# Patient Record
Sex: Male | Born: 1960 | Race: White | Hispanic: No | Marital: Single | Smoking: Former smoker
Health system: Southern US, Community
[De-identification: ages and names within clinical notes are randomized; demographics above are authoritative.]

## PROBLEM LIST (undated history)

## (undated) DIAGNOSIS — K769 Liver disease, unspecified: Secondary | ICD-10-CM

## (undated) DIAGNOSIS — IMO0001 Reserved for inherently not codable concepts without codable children: Secondary | ICD-10-CM

## (undated) DIAGNOSIS — H539 Unspecified visual disturbance: Secondary | ICD-10-CM

## (undated) DIAGNOSIS — M1990 Unspecified osteoarthritis, unspecified site: Secondary | ICD-10-CM

## (undated) DIAGNOSIS — IMO0002 Reserved for concepts with insufficient information to code with codable children: Secondary | ICD-10-CM

## (undated) DIAGNOSIS — G319 Degenerative disease of nervous system, unspecified: Secondary | ICD-10-CM

## (undated) DIAGNOSIS — R160 Hepatomegaly, not elsewhere classified: Secondary | ICD-10-CM

## (undated) DIAGNOSIS — F419 Anxiety disorder, unspecified: Secondary | ICD-10-CM

## (undated) HISTORY — PX: FINGER SURGERY: SHX640

## (undated) HISTORY — PX: APPENDECTOMY: SHX54

## (undated) HISTORY — PX: ANKLE SURGERY: SHX546

## (undated) HISTORY — DX: Liver disease, unspecified: K76.9

## (undated) HISTORY — DX: Unspecified visual disturbance: H53.9

## (undated) HISTORY — DX: Hepatomegaly, not elsewhere classified: R16.0

## (undated) HISTORY — PX: BACK SURGERY: SHX140

---

## 2000-06-22 ENCOUNTER — Ambulatory Visit (HOSPITAL_BASED_OUTPATIENT_CLINIC_OR_DEPARTMENT_OTHER): Admission: RE | Admit: 2000-06-22 | Discharge: 2000-06-22 | Payer: Self-pay | Admitting: Orthopedic Surgery

## 2000-06-22 ENCOUNTER — Encounter (INDEPENDENT_AMBULATORY_CARE_PROVIDER_SITE_OTHER): Payer: Self-pay | Admitting: Specialist

## 2002-09-27 ENCOUNTER — Ambulatory Visit (HOSPITAL_COMMUNITY): Admission: RE | Admit: 2002-09-27 | Discharge: 2002-09-27 | Payer: Self-pay | Admitting: Family Medicine

## 2002-09-27 ENCOUNTER — Encounter: Payer: Self-pay | Admitting: Family Medicine

## 2002-10-18 ENCOUNTER — Encounter: Payer: Self-pay | Admitting: Family Medicine

## 2002-10-18 ENCOUNTER — Ambulatory Visit (HOSPITAL_COMMUNITY): Admission: RE | Admit: 2002-10-18 | Discharge: 2002-10-18 | Payer: Self-pay | Admitting: Family Medicine

## 2002-10-18 ENCOUNTER — Encounter (HOSPITAL_COMMUNITY): Admission: RE | Admit: 2002-10-18 | Discharge: 2002-11-17 | Payer: Self-pay | Admitting: Orthopaedic Surgery

## 2003-07-20 ENCOUNTER — Emergency Department (HOSPITAL_COMMUNITY): Admission: EM | Admit: 2003-07-20 | Discharge: 2003-07-20 | Payer: Self-pay | Admitting: Emergency Medicine

## 2003-07-22 ENCOUNTER — Emergency Department (HOSPITAL_COMMUNITY): Admission: EM | Admit: 2003-07-22 | Discharge: 2003-07-22 | Payer: Self-pay | Admitting: Emergency Medicine

## 2003-08-16 ENCOUNTER — Emergency Department (HOSPITAL_COMMUNITY): Admission: EM | Admit: 2003-08-16 | Discharge: 2003-08-16 | Payer: Self-pay | Admitting: Emergency Medicine

## 2003-09-30 ENCOUNTER — Inpatient Hospital Stay (HOSPITAL_COMMUNITY): Admission: EM | Admit: 2003-09-30 | Discharge: 2003-10-03 | Payer: Self-pay | Admitting: Emergency Medicine

## 2003-10-06 ENCOUNTER — Emergency Department (HOSPITAL_COMMUNITY): Admission: EM | Admit: 2003-10-06 | Discharge: 2003-10-06 | Payer: Self-pay | Admitting: Emergency Medicine

## 2003-11-08 ENCOUNTER — Ambulatory Visit (HOSPITAL_COMMUNITY): Admission: RE | Admit: 2003-11-08 | Discharge: 2003-11-08 | Payer: Self-pay | Admitting: Family Medicine

## 2003-11-09 ENCOUNTER — Ambulatory Visit (HOSPITAL_COMMUNITY): Admission: RE | Admit: 2003-11-09 | Discharge: 2003-11-09 | Payer: Self-pay | Admitting: Family Medicine

## 2003-11-13 ENCOUNTER — Ambulatory Visit: Payer: Self-pay | Admitting: Urgent Care

## 2003-11-14 ENCOUNTER — Ambulatory Visit (HOSPITAL_COMMUNITY): Admission: RE | Admit: 2003-11-14 | Discharge: 2003-11-14 | Payer: Self-pay | Admitting: Family Medicine

## 2003-11-15 ENCOUNTER — Emergency Department (HOSPITAL_COMMUNITY): Admission: EM | Admit: 2003-11-15 | Discharge: 2003-11-15 | Payer: Self-pay | Admitting: Emergency Medicine

## 2004-01-03 ENCOUNTER — Ambulatory Visit: Payer: Self-pay | Admitting: Internal Medicine

## 2004-01-04 ENCOUNTER — Ambulatory Visit (HOSPITAL_COMMUNITY): Admission: RE | Admit: 2004-01-04 | Discharge: 2004-01-04 | Payer: Self-pay | Admitting: Internal Medicine

## 2004-01-06 ENCOUNTER — Emergency Department (HOSPITAL_COMMUNITY): Admission: EM | Admit: 2004-01-06 | Discharge: 2004-01-07 | Payer: Self-pay | Admitting: *Deleted

## 2004-03-04 ENCOUNTER — Emergency Department (HOSPITAL_COMMUNITY): Admission: EM | Admit: 2004-03-04 | Discharge: 2004-03-04 | Payer: Self-pay | Admitting: *Deleted

## 2004-03-09 ENCOUNTER — Emergency Department (HOSPITAL_COMMUNITY): Admission: EM | Admit: 2004-03-09 | Discharge: 2004-03-09 | Payer: Self-pay | Admitting: Emergency Medicine

## 2004-05-12 ENCOUNTER — Emergency Department (HOSPITAL_COMMUNITY): Admission: EM | Admit: 2004-05-12 | Discharge: 2004-05-12 | Payer: Self-pay | Admitting: *Deleted

## 2004-07-07 ENCOUNTER — Emergency Department (HOSPITAL_COMMUNITY): Admission: EM | Admit: 2004-07-07 | Discharge: 2004-07-07 | Payer: Self-pay | Admitting: *Deleted

## 2004-08-20 ENCOUNTER — Emergency Department (HOSPITAL_COMMUNITY): Admission: EM | Admit: 2004-08-20 | Discharge: 2004-08-20 | Payer: Self-pay | Admitting: Emergency Medicine

## 2004-09-03 ENCOUNTER — Emergency Department (HOSPITAL_COMMUNITY): Admission: EM | Admit: 2004-09-03 | Discharge: 2004-09-03 | Payer: Self-pay | Admitting: Emergency Medicine

## 2004-10-17 ENCOUNTER — Observation Stay (HOSPITAL_COMMUNITY): Admission: EM | Admit: 2004-10-17 | Discharge: 2004-10-18 | Payer: Self-pay | Admitting: Emergency Medicine

## 2004-10-17 ENCOUNTER — Ambulatory Visit: Payer: Self-pay | Admitting: *Deleted

## 2005-04-02 ENCOUNTER — Emergency Department (HOSPITAL_COMMUNITY): Admission: EM | Admit: 2005-04-02 | Discharge: 2005-04-02 | Payer: Self-pay | Admitting: Emergency Medicine

## 2005-04-27 ENCOUNTER — Ambulatory Visit: Payer: Self-pay | Admitting: Cardiology

## 2005-05-07 ENCOUNTER — Ambulatory Visit (HOSPITAL_COMMUNITY): Admission: RE | Admit: 2005-05-07 | Discharge: 2005-05-07 | Payer: Self-pay | Admitting: *Deleted

## 2006-09-05 ENCOUNTER — Inpatient Hospital Stay (HOSPITAL_COMMUNITY): Admission: AD | Admit: 2006-09-05 | Discharge: 2006-09-20 | Payer: Self-pay | Admitting: Psychiatry

## 2006-09-05 ENCOUNTER — Emergency Department (HOSPITAL_COMMUNITY): Admission: EM | Admit: 2006-09-05 | Discharge: 2006-09-05 | Payer: Self-pay | Admitting: Emergency Medicine

## 2006-09-05 ENCOUNTER — Ambulatory Visit: Payer: Self-pay | Admitting: Psychiatry

## 2007-05-26 ENCOUNTER — Emergency Department (HOSPITAL_COMMUNITY): Admission: EM | Admit: 2007-05-26 | Discharge: 2007-05-26 | Payer: Self-pay | Admitting: Emergency Medicine

## 2007-07-19 ENCOUNTER — Emergency Department (HOSPITAL_COMMUNITY): Admission: EM | Admit: 2007-07-19 | Discharge: 2007-07-19 | Payer: Self-pay | Admitting: Emergency Medicine

## 2007-10-17 ENCOUNTER — Emergency Department (HOSPITAL_COMMUNITY): Admission: EM | Admit: 2007-10-17 | Discharge: 2007-10-17 | Payer: Self-pay | Admitting: Emergency Medicine

## 2008-02-07 ENCOUNTER — Emergency Department (HOSPITAL_COMMUNITY): Admission: EM | Admit: 2008-02-07 | Discharge: 2008-02-07 | Payer: Self-pay | Admitting: Emergency Medicine

## 2008-05-23 ENCOUNTER — Emergency Department (HOSPITAL_COMMUNITY): Admission: EM | Admit: 2008-05-23 | Discharge: 2008-05-23 | Payer: Self-pay | Admitting: Emergency Medicine

## 2008-05-29 ENCOUNTER — Ambulatory Visit (HOSPITAL_COMMUNITY): Admission: RE | Admit: 2008-05-29 | Discharge: 2008-05-29 | Payer: Self-pay | Admitting: Family Medicine

## 2008-07-25 ENCOUNTER — Other Ambulatory Visit: Payer: Self-pay

## 2008-07-27 ENCOUNTER — Inpatient Hospital Stay (HOSPITAL_COMMUNITY): Admission: RE | Admit: 2008-07-27 | Discharge: 2008-08-10 | Payer: Self-pay | Admitting: *Deleted

## 2008-07-27 ENCOUNTER — Ambulatory Visit: Payer: Self-pay | Admitting: *Deleted

## 2008-08-06 ENCOUNTER — Encounter: Payer: Self-pay | Admitting: Emergency Medicine

## 2008-10-10 ENCOUNTER — Emergency Department (HOSPITAL_COMMUNITY): Admission: EM | Admit: 2008-10-10 | Discharge: 2008-10-10 | Payer: Self-pay | Admitting: Emergency Medicine

## 2009-04-23 ENCOUNTER — Emergency Department (HOSPITAL_COMMUNITY): Admission: EM | Admit: 2009-04-23 | Discharge: 2009-04-23 | Payer: Self-pay | Admitting: Emergency Medicine

## 2010-03-04 ENCOUNTER — Emergency Department (HOSPITAL_COMMUNITY)
Admission: EM | Admit: 2010-03-04 | Discharge: 2010-03-04 | Disposition: A | Discharge status: Home / Self Care | Payer: Medicaid Other | Attending: Emergency Medicine | Admitting: Emergency Medicine

## 2010-03-04 DIAGNOSIS — F329 Major depressive disorder, single episode, unspecified: Secondary | ICD-10-CM | POA: Insufficient documentation

## 2010-03-04 DIAGNOSIS — G8929 Other chronic pain: Secondary | ICD-10-CM | POA: Insufficient documentation

## 2010-03-04 DIAGNOSIS — M549 Dorsalgia, unspecified: Secondary | ICD-10-CM | POA: Insufficient documentation

## 2010-03-04 DIAGNOSIS — K219 Gastro-esophageal reflux disease without esophagitis: Secondary | ICD-10-CM | POA: Insufficient documentation

## 2010-03-04 DIAGNOSIS — K625 Hemorrhage of anus and rectum: Secondary | ICD-10-CM | POA: Insufficient documentation

## 2010-03-04 DIAGNOSIS — F411 Generalized anxiety disorder: Secondary | ICD-10-CM | POA: Insufficient documentation

## 2010-03-04 DIAGNOSIS — F3289 Other specified depressive episodes: Secondary | ICD-10-CM | POA: Insufficient documentation

## 2010-03-04 DIAGNOSIS — Z79899 Other long term (current) drug therapy: Secondary | ICD-10-CM | POA: Insufficient documentation

## 2010-03-04 LAB — DIFFERENTIAL
Basophils Absolute: 0 10*3/uL (ref 0.0–0.1)
Basophils Relative: 0 % (ref 0–1)
Eosinophils Absolute: 0.1 10*3/uL (ref 0.0–0.7)
Eosinophils Relative: 2 % (ref 0–5)
Lymphocytes Relative: 40 % (ref 12–46)
Lymphs Abs: 2.4 10*3/uL (ref 0.7–4.0)
Monocytes Absolute: 0.5 10*3/uL (ref 0.1–1.0)
Monocytes Relative: 8 % (ref 3–12)
Neutro Abs: 3.1 10*3/uL (ref 1.7–7.7)
Neutrophils Relative %: 51 % (ref 43–77)

## 2010-03-04 LAB — CBC
HCT: 43.9 % (ref 39.0–52.0)
Hemoglobin: 14.3 g/dL (ref 13.0–17.0)
MCH: 30.9 pg (ref 26.0–34.0)
MCHC: 32.6 g/dL (ref 30.0–36.0)
MCV: 94.8 fL (ref 78.0–100.0)
Platelets: 147 10*3/uL — ABNORMAL LOW (ref 150–400)
RBC: 4.63 MIL/uL (ref 4.22–5.81)
RDW: 13.4 % (ref 11.5–15.5)
WBC: 6.1 10*3/uL (ref 4.0–10.5)

## 2010-03-04 LAB — URINALYSIS, ROUTINE W REFLEX MICROSCOPIC
Nitrite: NEGATIVE
Protein, ur: NEGATIVE mg/dL
Urine Glucose, Fasting: NEGATIVE mg/dL

## 2010-03-04 LAB — BASIC METABOLIC PANEL
BUN: 17 mg/dL (ref 6–23)
Chloride: 100 mEq/L (ref 96–112)
Creatinine, Ser: 1.24 mg/dL (ref 0.4–1.5)
GFR calc Af Amer: >60 mL/min (ref >60)
GFR calc non Af Amer: >60 mL/min (ref >60)
Potassium: 4.2 mEq/L (ref 3.5–5.1)

## 2010-03-04 LAB — PROTIME-INR
INR: 0.99 (ref 0.00–1.49)
Prothrombin Time: 13.3 seconds (ref 11.6–15.2)

## 2010-03-04 LAB — APTT: aPTT: 26 seconds (ref 24–37)

## 2010-03-12 ENCOUNTER — Other Ambulatory Visit (HOSPITAL_COMMUNITY): Payer: Self-pay | Admitting: Family Medicine

## 2010-03-12 ENCOUNTER — Ambulatory Visit (HOSPITAL_COMMUNITY)
Admission: RE | Admit: 2010-03-12 | Discharge: 2010-03-12 | Disposition: A | Discharge status: Home / Self Care | Payer: Medicaid Other | Source: Ambulatory Visit | Attending: Family Medicine | Admitting: Family Medicine

## 2010-03-12 DIAGNOSIS — M47812 Spondylosis without myelopathy or radiculopathy, cervical region: Secondary | ICD-10-CM | POA: Insufficient documentation

## 2010-03-12 DIAGNOSIS — M542 Cervicalgia: Secondary | ICD-10-CM

## 2010-03-12 DIAGNOSIS — M5380 Other specified dorsopathies, site unspecified: Secondary | ICD-10-CM | POA: Insufficient documentation

## 2010-04-02 LAB — COMPREHENSIVE METABOLIC PANEL
ALT: 12 U/L (ref 0–53)
AST: 19 U/L (ref 0–37)
Albumin: 3.9 g/dL (ref 3.5–5.2)
Alkaline Phosphatase: 57 U/L (ref 39–117)
Chloride: 102 mEq/L (ref 96–112)
GFR calc Af Amer: >60 mL/min (ref >60)
Potassium: 4.2 mEq/L (ref 3.5–5.1)
Sodium: 137 mEq/L (ref 135–145)
Total Bilirubin: 0.5 mg/dL (ref 0.3–1.2)
Total Protein: 6.8 g/dL (ref 6.0–8.3)

## 2010-04-02 LAB — DIFFERENTIAL
Basophils Absolute: 0 10*3/uL (ref 0.0–0.1)
Basophils Relative: 0 % (ref 0–1)
Eosinophils Relative: 3 % (ref 0–5)
Monocytes Absolute: 0.5 10*3/uL (ref 0.1–1.0)
Monocytes Relative: 10 % (ref 3–12)
Neutro Abs: 3.3 10*3/uL (ref 1.7–7.7)

## 2010-04-02 LAB — CBC
HCT: 40.7 % (ref 39.0–52.0)
Platelets: 138 10*3/uL — ABNORMAL LOW (ref 150–400)
RDW: 14.9 % (ref 11.5–15.5)
WBC: 5.1 10*3/uL (ref 4.0–10.5)

## 2010-04-05 ENCOUNTER — Emergency Department (HOSPITAL_COMMUNITY)
Admission: EM | Admit: 2010-04-05 | Discharge: 2010-04-05 | Disposition: A | Discharge status: Home / Self Care | Payer: Medicaid Other | Attending: Emergency Medicine | Admitting: Emergency Medicine

## 2010-04-05 DIAGNOSIS — K625 Hemorrhage of anus and rectum: Secondary | ICD-10-CM | POA: Insufficient documentation

## 2010-04-05 DIAGNOSIS — M25469 Effusion, unspecified knee: Secondary | ICD-10-CM

## 2010-04-06 ENCOUNTER — Other Ambulatory Visit (HOSPITAL_COMMUNITY): Payer: Medicaid Other

## 2010-04-18 LAB — POCT CARDIAC MARKERS: Troponin i, poc: <0.05 ng/mL (ref 0.00–0.09)

## 2010-04-18 LAB — BASIC METABOLIC PANEL
BUN: 11 mg/dL (ref 6–23)
Creatinine, Ser: 1.26 mg/dL (ref 0.4–1.5)
GFR calc non Af Amer: >60 mL/min (ref >60)
Glucose, Bld: 183 mg/dL — ABNORMAL HIGH (ref 70–99)
Potassium: 3.3 mEq/L — ABNORMAL LOW (ref 3.5–5.1)

## 2010-04-20 LAB — DIFFERENTIAL
Eosinophils Relative: 1 % (ref 0–5)
Lymphocytes Relative: 17 % (ref 12–46)
Lymphs Abs: 1.2 10*3/uL (ref 0.7–4.0)
Monocytes Absolute: 0.5 10*3/uL (ref 0.1–1.0)

## 2010-04-20 LAB — CBC
HCT: 37.5 % — ABNORMAL LOW (ref 39.0–52.0)
Hemoglobin: 13.1 g/dL (ref 13.0–17.0)
MCV: 86.8 fL (ref 78.0–100.0)
WBC: 7 10*3/uL (ref 4.0–10.5)

## 2010-04-20 LAB — BASIC METABOLIC PANEL
Chloride: 97 mEq/L (ref 96–112)
GFR calc non Af Amer: >60 mL/min (ref >60)
Glucose, Bld: 100 mg/dL — ABNORMAL HIGH (ref 70–99)
Potassium: 3.3 mEq/L — ABNORMAL LOW (ref 3.5–5.1)
Sodium: 132 mEq/L — ABNORMAL LOW (ref 135–145)

## 2010-04-20 LAB — RAPID URINE DRUG SCREEN, HOSP PERFORMED
Amphetamines: NOT DETECTED
Benzodiazepines: POSITIVE — AB
Tetrahydrocannabinol: NOT DETECTED

## 2010-04-20 LAB — ETHANOL: Alcohol, Ethyl (B): <5 mg/dL (ref 0–10)
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
## 2010-05-27 NOTE — H&P (Signed)
[name], MORT NO.:  [id number]   MEDICAL RECORD NO.:  [id number]          PATIENT TYPE:  IPS   LOCATION:  0503                          FACILITY:  [facility]   PHYSICIAN:  Geoffery Lyons, M.D.      DATE OF BIRTH:  08-Apr-1960   DATE OF ADMISSION:  09/05/2006  DATE OF DISCHARGE:                       PSYCHIATRIC ADMISSION ASSESSMENT   IDENTIFYING INFORMATION:  This is a 50 year old male voluntarily  admitted on September 05, 2006.   HISTORY OF PRESENT ILLNESS:  The patient presents with a history  depression for one week.  Has been increasing.  He had a plan to drive  into traffic.  The patient states that he has been staying in bed.  He  daydreams all day, isolates. finds himself in a bad mood.  He states his  appetite has been satisfactory.  Reports a history of drinking one year  ago.  Stressors are worrying about finances and reports a new  girlfriend.   PAST PSYCHIATRIC HISTORY:  First admission to [facility].  Was in [place] in the past.  A remote history of hospitalization  for depression.  Was admitted for a three-week period.  In the past has  been on antidepressants and has found them ineffective.   SOCIAL HISTORY:  This is a 50 year old male.  He lives with his son and  an elderly male.  His son is 108 years of age who recently got a DUI.  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   FAMILY HISTORY:  Denies.   ALCOHOL/DRUG HISTORY:  The patient denies any current alcohol or drug  use.   PRIMARY CARE PHYSICIAN:  Dr. Sudie Bailey in [place].   MEDICAL PROBLEMS:  Acid reflux.   MEDICATIONS:  Has been on Xanax 2 mg q.i.d., Zyprexa 10 mg daily and  Prevacid 30 mg daily.   ALLERGIES:  No known allergies.   PHYSICAL EXAMINATION:  The patient was assessed at [facility].  This is a middle-aged male who is unkempt, has long hair, unshaved but  again in no acute distress.  Seems to have significant  depression.  Temperature is 98.5, heart rate 94, respirations 20, blood pressure  126/94, 97% saturation, weight 174 pounds, height 6 feet 1 inch tall.   LABORATORY DATA:  CBC within normal limits.  Alcohol level less than 5.  Urine drug screen is positive for benzodiazepines.  Urinalysis is  negative.   MENTAL STATUS EXAM:  This is a fully alert, cooperative male, fair eye  contact.  Again somewhat unkempt.  Speech is clear, normal pace and  tone.  The patient's mood is depressed.  Affect is flat.  Thought  processes endorsing suicidal thoughts.  No evidence of any delusions or  hallucinations.  Cognitive function intact.  Memory is good.  Judgment  and insight are fair.  Concentration intact.   DIAGNOSES:  AXIS I:  Major depressive disorder, recurrent, severe.  AXIS II:  Deferred.  AXIS III:  Gastroesophageal reflux disease.  AXIS IV:  Other psychosocial problems related to burden of illness.  AXIS V:  Current 30.   PLAN:  Contract for safety.  Stabilize his mood and thinking.  Discussion of tapering of Xanax was discussed with the patient and we  will begin to taper Xanax by 1/2 mg at a time per dose.  We will also  add Neurontin to assist with symptoms of anxiety and mood stabilization.  Consider family session and casemanager will assess follow-up with the  patient and psychiatrist.   TENTATIVE LENGTH OF STAY:  Four to six days.      Landry Corporal, N.P.      Geoffery Lyons, M.D.  Electronically Signed    JO/MEDQ  D:  09/06/2006  T:  09/06/2006  Job:  [id number]

## 2010-05-30 NOTE — Discharge Summary (Signed)
NAMEDURELLE, ZEPEDA               ACCOUNT NO.:  [id number]   MEDICAL RECORD NO.:  [id number]          PATIENT TYPE:  OBV   LOCATION:  A209                          FACILITY:  [facility]   PHYSICIAN:  Mila Homer. Sudie Bailey, M.D.DATE OF BIRTH:  February 21, 1960   DATE OF ADMISSION:  10/17/2004  DATE OF DISCHARGE:  10/07/2006LH                                 DISCHARGE SUMMARY   This 49 year old was admitted to the hospital with chest pain. He had an  overnight stay for workup of same. The pain had cleared by the day of  discharge. His admission CBC, BNP, PT/INR, and cardiac markers were all  normal. His urine drug screen showed benzodiazepines and his cardiac panels  x3 were negative. Chest x-ray was normal.   He was put into the hospital under observation and seen by Dr. Vida Roller of [facility]. He was given O2 at two liters by nasal  cannula. Continued on Zyprexa 2.5 mg daily with Protonix 40 mg daily, Xanax  2 mg t.i.d., Lexapro 20 mg daily. Again, he was symptom-free, ready for  discharge home the second day.   ASSESSMENT:  1.  Chest wall pain, questionable etiology.  2.  Manic depressive disease.   He will be discharged home on his admission medications including alprazolam  2 mg t.i.d., Zyprexa 2.5 mg daily, and Protonix 40 mg daily from the office.  Further workup will be with myself and Dr. Vida Roller if he has  recurrent symptomatology.      Mila Homer. Sudie Bailey, M.D.  Electronically Signed     SDK/MEDQ  D:  10/18/2004  T:  10/18/2004  Job:  [id number]

## 2010-05-30 NOTE — Discharge Summary (Signed)
[name], [name] NO.:  [id number]   MEDICAL RECORD NO.:  [id number]          PATIENT TYPE:  IPS   LOCATION:  0503                          FACILITY:  [facility]   PHYSICIAN:  Geoffery Lyons, M.D.      DATE OF BIRTH:  1960/11/07   DATE OF ADMISSION:  09/05/2006  DATE OF DISCHARGE:  09/20/2006                               DISCHARGE SUMMARY   CHIEF COMPLAINT AND PRESENT ILLNESS:  This was the first admission to  [facility] for this 50 year old male voluntarily  admitted.  History of depression, has been increasing, had a plan to  drive into traffic.  He had been staying in bed, daydreams all day,  isolates, finds himself in a bad mood.  History of drinking a year ago.  Claimed worrying about finances and reports new girlfriend.  Had been  using Xanax, prescribed 2 mg four times a day, using much more than  that.  Wanting to come to detox and get help with his mood.   PAST PSYCHIATRIC HISTORY:  First time at [facility].  Was at [facility] in the past.  He has been on antidepressant.  Has found  them to be ineffective.   ALCOHOL/DRUG HISTORY:  Persistent abuse of Xanax as already stated.   MEDICAL HISTORY:  Reflux.   MEDICATIONS:  Xanax 2 mg four times a day and he takes more than  prescribed, Zyprexa 10 mg per day, Prevacid 30 mg per day.   PHYSICAL EXAMINATION:  Performed and failed to show any acute findings.   LABORATORY DATA:  SGOT 14, SGPT 12, total bilirubin 1.0.  CBC within  normal limits.  UDS positive for benzodiazepines.   MENTAL STATUS EXAM:  Fully alert cooperative male, fair eye contact,  unkempt.  Speech was clear, normal in rate, tempo and production.  Depressed mood.  Affect constricted.  Thought processes endorsed  suicidal thoughts, feeling overwhelmed but no evidence of delusions.  No  hallucinations.  No homicidal ideas.  Cognition well-preserved.   ADMISSION DIAGNOSES:  AXIS I:  Benzodiazepine dependence.   Major  depressive disorder.  AXIS II:  No diagnosis.  AXIS III:  Gastroesophageal reflux.  AXIS IV:  Moderate.  AXIS V:  GAF upon admission 30; highest GAF in the last year 60.   HOSPITAL COURSE:  He was admitted.  He was started in individual and  group psychotherapy.  We started the process of dropping the Xanax.  Kept him on the Zyprexa and he was started on Effexor and he was given  some Neurontin.  As already stated, he endorsed depression for the last  4-5 years, worse in the last week.  Depression for the last 4-5 years,  has been worse the last three to four weeks.  Girlfriend is in and out.  Was in [place] several years ago, separated from the girlfriend of  23 years, separated for three weeks, endorsed suicidal ideations then.  His primary care Nasha Diss gave him the Xanax and the Zyprexa.  Son lives  with him, 39 year old, has had a DUI.  There  is also another gentleman  that lives with him.  Working in the [employer] in [place]  for the last two years, Xanax for several years, has had thoughts of  killing himself.  Has been on Prozac, Zoloft, Paxil, Wellbutrin and  Lexapro.  On September 07, 2006, endorsed persistent depression, evidenced  psychomotor retardation, a sense of hopelessness and helplessness, in  bed, disheveled, avoids, isolates, depressed, anxious mood.  We  continued to drop the Xanax and worked with the Neurontin.  On September 08, 2006, he was still very depressed, psychomotor retardation, in bed.  No energy, no motivation, unable to go to group.  His affect was pretty  flat.  Would not answer questions about suicidal thoughts.  We continued  to drop the Xanax, increase the Effexor and work with the Neurontin.  As  we dropped the Xanax, had some difficulties, has been up, making more of  an effort to be involved but still depressed, feeling overwhelmed, sense  of hopelessness and helplessness.  On September 10, 2006, he was  experiencing  cravings, not only for the Xanax, but also for alcohol.  Working on pushing himself, get out of bed and go to groups.  Endorsed  depression, anxiety, cravings.  As we pursued further clarification of  the history, he was taking more than 8 mg of Xanax per day.  Due to the  agitation and the spells of anxiety, he was given a trial with  Risperdal.  On September 14, 2006, he was endorsing agitation, feeling of  passing out, felt that the Zyprexa and later the Risperdal were helping  with the feeling of agitation and passing out.  Did not see that as a  side effect from the medication.  We continued to detox.  Xanax was  going to be dropped to 2.5 mg twice a day.  We continued to work with  the Neurontin.  On September 15, 2006, increased tremulousness,  agitation, benzodiazepine withdrawal versus EPS, akathisia.  He did  respond to Librium and Cogentin.  We pursued the Librium and kept the  Cogentin on a standing order.  Endorsed that he would hurt himself if  released from the hospital.  We already switched to Librium.  Endorsed  anxiety, agitation, was starting to go down on the benzodiazepine so we  continued to slowly detox.  We tried to simplify.  We decreased the  Neurontin further to rule out any possible adverse side effects from the  medication he was taking.  He was evidencing some on and off shakes and  dizzy spells but gradually started getting better and better.  On  September 18, 2006, he was less shaky, did sleep most of the time, wanted  to make this work so we decreased the Neurontin and decreased the  Librium.  On September 19, 2006, he was better, tolerating the Librium  down to 25 mg twice a day.  We went down to Librium 10 mg three times a  day.  By September 20, 2006, endorsed that he was really feeling better.  Endorsed he was not going back to use the Xanax.  Feeling so much  better.  No evidence of shakiness or any other symptoms suggestive of  active withdrawal.  He was  still taking Librium 10 mg.  He was wanting  to go home.  Felt safe to go, so we were working towards finish detoxing  on an outpatient basis.  Endorsed no active suicidal or homicidal  ideation.   DISCHARGE DIAGNOSES:  AXIS I:  Benzodiazepine dependence.  Anxiety  disorder not otherwise specified.  Major depressive disorder.  AXIS II:  No diagnosis.  AXIS III:  Gastroesophageal reflux.  AXIS IV:  Moderate.  AXIS V:  GAF upon discharge 55-60.   DISCHARGE MEDICATIONS:  1. Zyprexa 10 mg at bedtime.  2. Effexor XR 75 mg per day.  3. Protonix 40 mg twice a day.  4. Librium 10 mg, 1 three times a day for two days, then Librium 10      mg, 1 twice a day for two days, then      Librium 10 mg, 1 daily for two days.  5. Neurontin 100 mg twice a day.   FOLLOWUP:  [facility].      Geoffery Lyons, M.D.  Electronically Signed     IL/MEDQ  D:  10/18/2006  T:  10/18/2006  Job:  [id number]

## 2010-05-30 NOTE — Discharge Summary (Signed)
NAMEMEHTAB, DOLBERRY NO.:  [id number]   MEDICAL RECORD NO.:  [id number]          PATIENT TYPE:  IPS   LOCATION:  0306                          FACILITY:  [facility]   PHYSICIAN:  Jasmine Pang, M.D. DATE OF BIRTH:  1960-07-03   DATE OF ADMISSION:  07/27/2008  DATE OF DISCHARGE:  08/10/2008                               DISCHARGE SUMMARY   IDENTIFICATION:  This is a 50 year old divorced white male, who was  admitted on a voluntary basis on July 27, 2008.   HISTORY OF PRESENT ILLNESS:  The patient presented to [facility].  He reported that he was having financial problems and sees no  end to his difficulties.  He had thought about cutting his wrist.  If he  had a gun, he would use it to kill himself.  His UDS was positive for  benzodiazepines.  He is prescribed Xanax.  He stated that he lost his  house and his car due to financial problems.  He was recently admitted  at [facility] for cellulitis and question MRSA of the left hand.  He spend some time with [place] here at [facility] back in 2008  and is requesting to come back for treatment.  He was with [place] on September 05, 2006, to September 21, 2006.  At that time, his depression had been  increasing.  He had a plan to drive into traffic.  He was worried about  finances.  He had been abusing his Xanax that was prescribed 2 mg q.i.d.  He continues to be prescribed Xanax 2 mg p.o. q.i.d. as verified by [facility].  For further admission information, see psychiatric  admission assessment.  Upon admission, the patient was given diagnosis  of major depressive disorder, recurrent, severe without psychotic  features.  On axis III, he was diagnosed with acid reflux, chronic back  pain, recent cellulitis of the left hand, which was possibly methicillin-  resistant Staphylococcus aureus.   PHYSICAL FINDINGS:  The patient had no acute physical or medical  problems noted.   HOSPITAL COURSE:  Upon  admission, the patient was continued on his home  medications of doxepin 10 mg p.o. q.i.d. and doxycycline 100 mg p.o.  b.i.d.  He was also started on trazodone 100 mg p.o. q.h.s. as well as a  Librium detox protocol.  He was on Seroquel 50 mg q.4 h. p.r.n. anxiety,  and on April 28, 2008, Librium detox protocol was discontinued instead  he was placed back on his Xanax 2 mg p.o. q.i.d.  In individual  sessions, the patient presented as disheveled male with fair eye  contact.  There was psychomotor retardation.  Speech was pressured.  Mood was depressed and anxious.  There was positive suicidal ideation.  There was no evidence of psychosis or a thought disorder.  On July 29, 2008, the patient stated I want to be able to function again instead  he wished he was dead.  He was very depressed and anxious.  He talked  about his son, who was using drugs.  He is quite worried about him.  He  was having difficulty sleeping and Seroquel was increased to 100 mg p.o.  q.4 h. p.r.n. anxiety and trazodone was increased to 200 mg p.o. q.h.s.  He continued to be depressed and anxious as hospitalization progressed.  He attended some unit therapeutic groups and activities.  He still had  passive suicidal ideation.  He talked with his son.  He is worried about  him and wants him to get a job.  He found out he was getting evicted  from his home.  Seroquel was increased to 200 mg p.o. q.h.s.  He was  also started on Celexa 20 mg p.o. q.a.m.  Due to continued difficulty  sleeping, his trazodone was increased to 300 mg p.o. q.h.s. on August 01, 2008.  He was very constipated and when lesser interventions failed  including a Fleet Enema, he was eventually transported to the ED via  security with tech for an abdominal x-ray because of the discomfort and  pain.  They ordered NuLytely as tolerated for constipation.  He  eventually was able to have a bowel movement.  As hospitalization  progressed, he became less  depressed and less anxious, suicidal ideation  resolved, and continued to worry about his son, he does not have a job  and his girlfriend is pregnant.  Sleep was good and appetite was good.  His mental status continued to improve.  On August 10, 2008, sleep was  good, appetite was good.  The patient was friendly, cooperative,  casually dressed with good eye contact.  Speech was normal rate and  flow.  Psychomotor activity was within normal limits.  Mood was less  depressed, less anxious.  Affect was consistent with mood.  There was no  suicidal or homicidal ideation.  No thoughts of self-injurious behavior.  No auditory or visual hallucinations.  No paranoia or delusions.  Thoughts were logical and goal-directed.  Thought content, no  predominant theme.  Cognitive was grossly intact.  Insight good.  Judgment good.  Impulse control good.  The patient was having no side  effects to his medications.  He wanted to go home today and was felt to  be safe for discharge.   DISCHARGE DIAGNOSES:  Axis I:  Major depressive disorder, recurrent,  severe without psychosis.  Axis II:  None.  Axis III:  Acid reflux, chronic back pain, recent cellulitis of the left  hand, which might have been methicillin-resistant Staphylococcus aureus.  Axis IV:  Severe (problems with primary support group, eviction,  financial, burden of psychiatric illness and burden of medical  problems).  Axis V:  Global assessment of functioning was 50 upon discharge.  GAF  was 30 upon admission.  GAF highest past year was 60-65.   DISCHARGE PLANS:  There were no specific activity level or dietary  restrictions.   POSTHOSPITAL CARE PLANS:  The patient will go to [facility] on  August 14, 2008, at 8 a.m.  He is also to see his doctor for his medical  problems.   DISCHARGE MEDICATIONS:  1. Doxepin 10 mg 4 times daily.  2. Xanax 1 mg 4 times a day.  3. Seroquel 200 mg p.o. q.h.s.  4. Celexa 20 mg daily.  5. Trazodone  100 mg at bedtime.   His doxycycline was stopped since his course of treatment was finished.      Jasmine Pang, M.D.  Electronically Signed     BHS/MEDQ  D:  08/24/2008  T:  08/25/2008  Job:  [id number]

## 2010-05-30 NOTE — Consult Note (Signed)
NAMEORRIE, SCHUBERT                         ACCOUNT NO.:  [id number]   MEDICAL RECORD NO.:  [id number]                   PATIENT TYPE:  INP   LOCATION:  A220                                 FACILITY:  [facility]   PHYSICIAN:  Lionel December, M.D.                 DATE OF BIRTH:  06-02-1960   DATE OF CONSULTATION:  10/01/2003  DATE OF DISCHARGE:                                   CONSULTATION   REQUESTING PHYSICIAN:  Dr. Ouida Sills.   PRIMARY CARE PHYSICIAN:  Dr. Sudie Bailey.   REASON FOR CONSULTATION:  Gastrointestinal bleeding.   HISTORY OF PRESENT ILLNESS:  Mr. Niebuhr is a 50 year old healthy Caucasian  male who has long standing history of small volume intermittent hematochezia  felt to be due to hemorrhoidal source. Yesterday morning he noted large  volume diarrheal stool which was followed by large amount of bright red  rectal bleeding. A few hours later he had a similar episode. He was admitted  to the hospital for further evaluation. Hemoglobin was stable on admission  at 40.2, hematocrit 41.5. Today his hemoglobin is 13 with hematocrit of  39.5. PT and INR are normal at 13.7 and 1.1. He has have history of  hemorrhoids. He reports long standing history of constipation with bowel  movements between every five and seven days. More recently, he has noted  soft once or twice daily stools which he describes as diarrhea. He is also  complaining of abdominal cramping.   He does have history of hiatal hernia as well as indigestion for which he  takes Prilosec 20 mg at home. He reports some nausea with the rectal  bleeding; however, denies any emesis. He denies any dysphagia or  odynophagia.   PAST MEDICAL HISTORY:  1.  Anxiety.  2.  Nephrolithiasis.  3.  Left ankle fracture.  4.  Panic attacks.  5.  GERD for 2 years.  6.  Hemorrhoidal disease.  7.  Cluster headaches.  8.  Questionable history of peptic ulcer disease.  9.  Abdominal trauma approximately three months ago where he dropped  a tool      box on his abdomen.  10. Appendectomy at age 79.   MEDICATIONS PRIOR TO ADMISSION:  1.  Prilosec 20 mg p.r.n.  2.  Xopenex 2 mg t.i.d.   FAMILY HISTORY:  No known family history of colorectal carcinoma or other  chronic GI problems. Mother and father are both alive; however, he does not  have contact with them as he was raised in the foster care system. He does  have multiple siblings as well as 1 son and 4 healthy daughters.   SOCIAL HISTORY:  Mr. Lazenby was with his girlfriend and son. He denies any  current alcohol, drug, or tobacco use; however, he does report a remote  history of heavy alcohol use and tobacco use as well as multiple types of  drug use.  REVIEW OF SYSTEMS:  CONSTITUTIONAL:  Weight has been stable. Appetite is  okay. He is complaining of some weakness. NEUROLOGICAL:  He is complaining  of headaches and denies any vertigo or dizziness. He denies any  paresthesias. CARDIOVASCULAR:  Denies any chest pain or palpitations.  PULMONARY:  Denies any shortness of breath, dyspnea, cough, or hemoptysis.  GASTROINTESTINAL:  See HPI.   PHYSICAL EXAMINATION:  VITAL SIGNS:  Temperature 96.5, pulse 53,  respirations 18, blood pressure 196/62, height 72 inches, weight 170.8.  GENERAL:  Mr. Quach is a well-developed, well-nourished, Caucasian male in  no acute distress. Alert, oriented, pleasant, cooperative.  HEENT:  Sclerae clear and nonicteric. Conjunctivae pink. Oropharynx clean  and moist, without any lesions.  NECK:  Supple without any mass or thyromegaly.  CHEST:  Heart regular rate and rhythm with normal S1 and S2 without any  murmurs, clicks, rubs, or gallops. Lungs clear to auscultation bilaterally.  ABDOMEN:  Positive bowel sounds x4. No bruits auscultated. Soft,  nondistended. He does have mild left lower quadrant tenderness on deep  palpation. No rebound tenderness or guarding.  RECTAL:  There is a large external protruding hemorrhoid between 8 and  9  o'clock which does appear to have recently bled. Small amount of dried  blood. No active bleeding or thrombus. No internal masses palpated. He  reportedly was Hemoccult positive in the emergency room.  EXTREMITIES:  2+ pedal pulses bilaterally. No edema.  SKIN:  Pink, warm, and dry without any rashes or jaundice.   LABORATORY DATA:  Calcium 8.7, sodium 138, potassium 3.7, chloride 103, CO2  of 33, BUN 6, creatinine 1.2, glucose 79. Liver function tests within normal  limits. PTT __________ .   ASSESSMENT:  Mr. Vanbeek is a 50 year old healthy Caucasian male with long  standing history for the last year or two of small volume intermittent  hematochezia who now presents with change in bowel habits which he describes  as diarrheal stools, approximately two episodes per day as well as large  volume bright red rectal bleeding post defecation twice yesterday. He is  also complaining of some low abdominal cramping and pain with these  episodes. Most likely, bleeding is due to hemorrhoidal source given findings  on exam as well as history; however, it is possible he may has colitis  although less likely. Further evaluation as necessary. He may have also  developed diverticular bleeding although typically this is painless. He also  has typical GERD symptoms on Prilosec 20 mg daily.   RECOMMENDATIONS:  1.  Will schedule colonoscopy with Dr. Karilyn Cota in the morning to discuss this      procedure including risks and benefits which include but are not limited      to bleeding, infection, perforation, and drug reactive. He agrees with      the plan and consent will be obtained.  2.  Will begin prep in the morning, Dulcolax 2 tablets p.o. q.h.s., GoLYTELY      to begin around 7 a.m., NPO post breakfast, clear liquid for now.      Further recommendations to follow.   Would like to thank Dr. Ouida Sills for allowing [place] to participate in the care of  Mr. Dilger.     KC/MEDQ  D:  10/01/2003  T:   10/01/2003  Job:  [id number]   cc:   Kingsley Callander. Ouida Sills, M.D.  [address]  [place]  [place] [address]  Fax: [phone]

## 2010-05-30 NOTE — Discharge Summary (Signed)
[name], [name]               ACCOUNT NO.:  [id number]   MEDICAL RECORD NO.:  [id number]          PATIENT TYPE:  INP   LOCATION:  A220                          FACILITY:  [facility]   PHYSICIAN:  Mila Homer. Sudie Bailey, M.D.DATE OF BIRTH:  February 17, 1960   DATE OF ADMISSION:  09/30/2003  DATE OF DISCHARGE:  09/21/2005LH                                 DISCHARGE SUMMARY   ADDENDUM:  Last diagnosis not known.  It was chronic constipation (BMs every  five to seven days for years).      SDK/MEDQ  D:  10/03/2003  T:  10/03/2003  Job:  [id number]

## 2010-05-30 NOTE — Op Note (Signed)
[place]  Patient:    [name], [name]                      MRN: [id number] Proc. Date: 06/22/00 Adm. Date:  [id number] Attending:  Cornell Barman                           Operative Report  PREOPERATIVE DIAGNOSIS:  Large cyst, right calcaneus.  POSTOPERATIVE DIAGNOSIS:  Bone cyst, right calcaneus.  OPERATION:  Curette large bone cyst, right calcaneus and packing with freeze-dried bone.  SURGEON:  Lenard Galloway. Chaney Malling, M.D.  ANESTHESIA:  General.  PROCEDURE:  Patient placed on the operative table in a spinal position with a pneumatic tourniquet about the upper thigh.  After a satisfactory general anesthesia the right lower extremity was prepped with DuraPrep and draped out in the usual manner.  Using the C-arm x-ray the calcaneus cyst was identified nicely.  An 18 gauge needled was placed on the lateral border of the calcaneus at the level of the cyst.  An incision was made through the mark made with the 18 gauge needle.  Skin edges were retracted.  Bleeders were coagulated. Dissection was carried subperiosteally of the lateral border of the calcaneus and brought proximally.  A small osteotome was used to cut a window in a lateral wall of the calcaneus.  The calcaneus was full of fluid and some fatty-looking tissue.  The large cyst of the calcaneus was very carefully and extensively debrided, had debris in the cyst; was sent to pathology for microscopic evaluation.  A great deal of time was spent curetting out the cystic area.  This was irrigated out with antibiotic solution.  Freeze-dried bone, 40 g, was then selected, soaked in saline, reconstituted, and then packed into the cyst.  This was done layer by layer using a bone tamp to pack in the freeze-dried bone.  Finally, all 40 g were inserted into the cyst and the bone graft was very snug and tight and filled all the corners of the cyst.  The skin was then closed with ______ and  sterile dressings were applied. The patient then returned to recovery room in excellent condition. Technically, this procedure went extremely well.  DRAINS:  None.  COMPLICATIONS:  None. DD:  06/22/00 TD:  06/22/00 Job: [id number]

## 2010-05-30 NOTE — Op Note (Signed)
[name], [name]               ACCOUNT NO.:  [id number]   MEDICAL RECORD NO.:  [id number]          PATIENT TYPE:  INP   LOCATION:  A220                          FACILITY:  [facility]   PHYSICIAN:  Lionel December, M.D.    DATE OF BIRTH:  1960/05/24   DATE OF PROCEDURE:  10/02/2003  DATE OF DISCHARGE:                                 OPERATIVE REPORT   PROCEDURE:  Esophagogastroduodenoscopy followed by total colonoscopy.   INDICATIONS:  [name] is a 50 year old Caucasian male who is admitted with  lower GI bleed and appears to be hemodynamically stable.  His hemoglobin has  dropped by more than 1.5 g.  He also has noted a change in his bowel habits  about four weeks ago which may well be secondary to using or Prilosec prior  to which he was taking Prevacid.  He also has chronic GERD and intermittent  epigastric pain.  He tells me that EGD was attempted several years ago but  he could not be sedated or could not tolerate the procedure.  Procedures and  risks were reviewed with the patient.  Informed consent was obtained.   PREMEDICATION:  Demerol 50 mg IV, Versed 20 mg IV.   FINDINGS:  The procedures were performed in the endoscopy suite.  The  patient's vital signs and O2 saturation were monitored during the procedure  and remained stable.  1.  Esophagogastroduodenoscopy.  The patient was placed in the left lateral recumbent position and Olympus  videoscope was passed through the oropharynx without any difficulty into the  esophagus.  Esophagus:  Mucosa of the esophagus was normal throughout.  Squamocolumnar  junction was unremarkable.  There was a small sliding hiatal hernia.  Stomach:  It was empty and distended very well on insufflation.  Folds of  the proximal stomach were normal.  Examination of the mucosa revealed a  small prepyloric scar with no erosions or ulcers noted.  Pyloric channel was  patent.  Angularis, fundus and cardia were examined by retroflexing the  scope and were  normal.  Duodenum:  Examination of the bulb reveals normal mucosa.  Scope was passed  through the second part of the duodenum.  Mucosa and folds were normal.  The  endoscope was withdrawn and the patient was prepared for procedure #2.   1.  Total colonoscopy.  Rectal examination performed.  No abnormality noted on the external or  digital exam.  Olympus videoscope was placed in the rectum and advanced  under vision to the sigmoid colon and beyond.  He had excellent preparation.  Scope was passed to the cecum which was identified by the ileocecal valve  and appendiceal orifice.  Short segment of terminal ileum was also examined  and was normal.  Pictures were taken for the record.  As the scope was  withdrawn, colonic mucosa was once again carefully examined.  There was a  small polyp at the rectosigmoid junction which was obliterated by cold  biopsy.  Rectal mucosa otherwise was normal.  Scope was retroflexed,  examined the anorectal junction and there were moderate sized external  hemorrhoids.  One appeared to have light clot with it.  This was felt to be  the source of bleeding.  Endoscope was straightened and withdrawn. The  patient tolerated the procedure well.   FINAL DIAGNOSIS:  1.  Small sliding hiatal hernia.  2.  Prepyloric scar but no evidence of active peptic ulcer disease.  3.  Small polyp ablated by cold biopsy from the rectosigmoid junction.  4.  Moderate size external hemorrhoids felt to be the source of the      patient's bleeding.   RECOMMENDATIONS:  1.  H pylori serology will be checked.  2.  High fiber diet.  3.  Anusol HC suppositories per rectum at bedtime for two weeks.  Atropine.  below the dentate line along with a prominent papilla.  the scope was straightened and withdrawn.      NR/MEDQ  D:  10/02/2003  T:  10/02/2003  Job:  [id number]   cc:   Mila Homer. Sudie Bailey, M.D.  [address] [place], [place] [address]  Fax: [phone]

## 2010-05-30 NOTE — H&P (Signed)
[name], [name]                         ACCOUNT NO.:  [id number]   MEDICAL RECORD NO.:  [id number]                   PATIENT TYPE:  INP   LOCATION:  ED99                                 FACILITY:  [facility]   PHYSICIAN:  Kingsley Callander. Ouida Sills, M.D.                  DATE OF BIRTH:  1960-12-09   DATE OF ADMISSION:  09/30/2003  DATE OF DISCHARGE:                                HISTORY & PHYSICAL   CHIEF COMPLAINT:  Rectal bleeding.   HISTORY OF PRESENT ILLNESS:  This patient is a 50 year old white male who  presented to the emergency room after two episodes of rectal bleeding today.  Each was described as nearly a quart in volume.  He has had some generalized  abdominal discomfort.  He denies vomiting or hematemesis.  He has had some  bleeding attributed to hemorrhoids in the past.  He describes having had a  barium enema several years ago.  He has never had a colonoscopy.  There is  no known family history of colon cancer.  He has not experienced weight  loss.   PAST MEDICAL HISTORY:  1.  Anxiety.  2.  Kidney stones.  3.  Left ankle fracture.   MEDICATIONS:  1.  Prilosec p.r.n.  2.  Xanax 2 mg t.i.d.   ALLERGIES:  None.   SOCIAL HISTORY:  He does not smoke cigarettes.  He rarely drinks a beer.   FAMILY HISTORY:  No known episodes of colon cancer in the family.   REVIEW OF SYSTEMS:  Noncontributory.   PHYSICAL EXAMINATION:  VITAL SIGNS:  Temperature 98.6. blood pressure  106/71.  Pulse 72.  Respirations 16.  Oxygen saturations 98% on room air.  GENERAL:  Alert, oriented white male.  HEENT:  No scleral icterus. Pharynx reveals carious dentition. No  thyromegaly or cervical lymphadenopathy.  LUNGS:  Clear.  HEART: Regular with no murmurs.  CHEST:  A pectus excavatum is present.  ABDOMEN:  Nontender, no hepatosplenomegaly.  He was hemoccult positive by  the ER physician.  EXTREMITIES:  Palpable pulses are present.  No cyanosis, clubbing or edema.  NEUROLOGIC:  Grossly intact.   LABORATORY DATA:  White count 6.0, hemoglobin 14.2, platelets 186,000.  Sodium 138, potassium 3.7, glucose 79. BUN 6, creatinine 1.2, calcium 8.7.  INR 1.1.   IMPRESSION:  1.  Lower GI bleed.  He is being hospitalized for serial hemoglobins,      monitoring of his blood pressure, and gastroenterology consultation.  He      will likely require colonoscopy initially.  He will also be treated with      IV Protonix.  2.  Anxiety.  Continue Xanax.  3.  History of kidney stones.      ROF/MEDQ  D:  09/30/2003  T:  10/01/2003  Job:  [id number]   cc:   Mila Homer. Sudie Bailey, M.D.  [address]  [place], [place]  [address]  Fax: [phone]

## 2010-05-30 NOTE — Group Therapy Note (Signed)
[name], [name]               ACCOUNT NO.:  [id number]   MEDICAL RECORD NO.:  [id number]          PATIENT TYPE:  INP   LOCATION:  A220                          FACILITY:  [facility]   PHYSICIAN:  Mila Homer. Sudie Bailey, M.D.DATE OF BIRTH:  May 17, 1960   DATE OF PROCEDURE:  DATE OF DISCHARGE:                                   PROGRESS NOTE   SUBJECTIVE:  He generally feels well today.  He has not had any more rectal  bleeding.  He presented to the hospital with two to three weeks of soft  stool mixed with blood, according to his history today.  He has also had  intermittent bleeding over the last several years, usually bright red blood.  He also notes he stopped his meds for depression about two to three months  ago, and he is very depressed.   OBJECTIVE:  VITAL SIGNS:  Temperature 97.2, pulse 49, respiratory rate 20,  blood pressure 101/65.  HEART:  Regular rhythm without murmur, rate of 60.  LUNGS:  Clear, moving air well.  ABDOMEN:  Soft without demonstrable hepatosplenomegaly or mass, but he does  have some mild diffuse tenderness noted in both upper quadrants, both lower  quadrants, and deep palpation.  EXTREMITIES:  There is no edema at the ankles.  SKIN:  Turgor is normal.  Mucous membranes moist.   ASSESSMENT:  1.  Gastrointestinal bleeding.  2.  Depression.   PLAN:  Continue IV normal saline.  He is now on GoLYTELY clean out for his  TCS and EGD for later today.  Continue Alprazolam 2 mg t.i.d., and  __________  mg every day.  He received 1-2 doses of ibuprofen 800 mg last  night and is also getting Dulcolax as part of his clean up program.      SDK/MEDQ  D:  10/02/2003  T:  10/02/2003  Job:  [id number]

## 2010-05-30 NOTE — Consult Note (Signed)
NAMEANTONEY, [name]               ACCOUNT NO.:  [id number]   MEDICAL RECORD NO.:  [id number]          PATIENT TYPE:  OBV   LOCATION:  A209                          FACILITY:  [facility]   PHYSICIAN:  Vida Roller, M.D.   DATE OF BIRTH:  27-Mar-1960   DATE OF CONSULTATION:  10/17/2004  DATE OF DISCHARGE:                                   CONSULTATION   CARDIOLOGY CONSULTATION:   PRIMARY CARE PHYSICIAN:  Dr. 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  He is now painfree and has been for the last several hours.  There has been no exacerbating or relieving symptoms or maneuvers.  He has  no shortness of breath.   PAST MEDICAL HISTORY:  His past medical history is significant for anxiety  and panic attacks, he has had nephrolithiasis in the past, he has a hiatal  hernia, he has gastroesophageal reflux disease, he has hemorrhoids, history  of cluster headaches, he has a history of peptic ulcer disease and he has  had an appendectomy in the past.   MEDICATIONS PRIOR TO ADMISSION:  Zyprexa 5 mg once a day, Prilosec 30 mg  once a day, Lexapro 10 mg once a day and Xanax he takes 2 mg three times a  day.   SOCIAL HISTORY:  He lives in [place].  He has five children.  They are all  healthy.  He does not smoke cigarettes although he used to.  He quit a  number of years ago.  He still chews tobacco.  He used to drink alcohol  heavily but does not anymore and does not use any illicit drugs.   FAMILY HISTORY:  His mother and father are not known to him (his biological  parents) as he was raised in a  foster home.   REVIEW OF SYSTEMS:  He denies any fever, chills, sweats, weight loss or  adenopathy, no headache, sinus tenderness, nasal discharge, bleeding, voice  changes, vertigo, photophobia, visual or hearing losses, no changes in his  dentition, no rashes, no lesions, no shortness of breath, no PND or  orthopnea, no edema, no palpitations, no presyncope, no syncope, no  claudication, no cough, no wheezing, no frequency or urgency of his urine,  no dysuria, no screening hematuria, no weakness, numbness or mood  disturbances beyond his baseline, no myalgias or arthralgias or joint  swelling, no nausea, vomiting, diarrhea, bright red blood per rectum,  melena, hematochezia, dysphagia, odynophagia or GERD symptoms, no abdominal  pain or bowel changes, no polyuria or polydipsia and the remainder of his  review of systems are negative.   PHYSICAL EXAMINATION:  GENERAL:  He is a  well-developed, well-nourished  white man who is in no apparent distress alert and oriented x4.  VITAL SIGNS:  His pulse is 70, respirations are 18, blood pressure 128/72,  he is saturating 99% on room air.  HEENT:  Examination of the head, ears, eyes, nose and throat is  unremarkable.  The neck is supple; there is no jugular venous distention or  carotid bruits.  CHEST:  His chest is clear to auscultation bilaterally.  His cardiovascular  exam is regular.  His point of maximal impulse is not displaced.  First and  second heart sounds are normal.  There is no murmurs noted.  ABDOMEN:  His abdomen is soft, nontender, with normoactive bowel sounds.  No  hepatosplenomegaly is noted.  GU/RECTAL AND BREAST EXAM:  Deferred.  EXTREMITIES:  His extremities are without clubbing or cyanosis.  Pulses are  2+.  MUSCULOSKELETAL:  Exam is unremarkable.  NEUROLOGIC:  Exam is nonfocal.   STUDIES:  Chest x-ray shows no acute findings.  Electrocardiogram shows  sinus rhythm at a rate of 66 with normal axes, normal intervals,  no ischemic  ST-T wave changes and no Q waves concerning for an old myocardial  infarction.  Urine drug screen is positive for benzodiazepines but nothing  else.  White blood cell count 5.1, H&H of 14 and 41, platelet count 176.  Sodium 135, potassium 3.6, chloride 103, bicarb 28, BUN 6, creatinine 1.3  and blood sugar is 83.  Point-of-care enzymes x2 are negative.  His INR is  1.   IMPRESSION:  This is a gentleman with atypical chest pain, minimal cardiac  risk factors and reassuring EKG and point-of-care markers.  I think he is a  very low risk patient.  I probably would admit him and cycle his enzymes.  If these are negative, I think I probably would work him up as an outpatient  and get an outpatient myocardial perfusion imaging study.  If his pain  recurs or he has positive enzymes then he should be sent to [facility] for  heart catheterization.      Vida Roller, M.D.  Electronically Signed     JH/MEDQ  D:  10/17/2004  T:  10/17/2004  Job:  [id number]

## 2010-05-30 NOTE — H&P (Signed)
[name], [name]               ACCOUNT NO.:  [id number]   MEDICAL RECORD NO.:  [id number]          PATIENT TYPE:  OBV   LOCATION:  A209                          FACILITY:  [facility]   PHYSICIAN:  Mila Homer. Sudie Bailey, M.D.DATE OF BIRTH:  [birth year]   DATE OF ADMISSION:  10/17/2004  DATE OF DISCHARGE:  LH                                HISTORY & PHYSICAL   HISTORY:  This 50 year old man developed left chest pain while in bed two  nights ago.  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  He was initially going to be seen in the  office but decided to go straight to the emergency room.   He has a long history of cigarette smoking.  He has also had an evaluation  for abdominal pain recently.   Currently he has been on alprazolam 2 mg t.i.d. for anxiety and Zyprexa 2.5  mg daily for manic depressive illness.  He denies nausea and vomiting,  palpitations, urinary or other GI symptomatology.   EXAMINATION:  Admission exam shows a pleasant middle-age man appears to be  oriented and alert.  He is well developed, well nourished.  Speech is  normal.  Sentence structure intact.  He seems to give a fairly good history.  There are negative anterior cervical nodes.  There is no axillary or  supraclavicular adenopathy.  The heart has a regular rhythm, rate of about  80.  The lungs are clear throughout  moving air well.  The abdomen is soft  without hepatosplenomegaly or mass and there is no edema of the ankles.   LABORATORY DATA:  White cell count is 5100, H&H 14 and 41.3, MCV of 86,  platelet count of 176,000.  His INR is 1.  MET-7 normal and his cardiac  markers are negative.  His urine drug screen is positive for  benzodiazepines.  Apparently his EKG is read by the ER physicians to be  normal.   ADMISSION DIAGNOSES:  1.  Chest pain, question etiology.  2.  Bipolar  disease.  3.  Chronic anxiety.   Patient will be staying in the hospital overnight.  We will rule out  myocardial infarction protocol with cardiac enzymes q.8h.  Twelve lead EKG  in the morning and followup by [facility] for further workup if  indicated.      Mila Homer. Sudie Bailey, M.D.  Electronically Signed    SDK/MEDQ  D:  10/17/2004  T:  10/17/2004  Job:  [id number]

## 2010-05-30 NOTE — Discharge Summary (Signed)
NAMEFLOYDE, DINGLEY               ACCOUNT NO.:  [id number]   MEDICAL RECORD NO.:  [id number]          PATIENT TYPE:  INP   LOCATION:  A220                          FACILITY:  [facility]   PHYSICIAN:  Mila Homer. Sudie Bailey, M.D.DATE OF BIRTH:  Jan 20, 1960   DATE OF ADMISSION:  09/30/2003  DATE OF DISCHARGE:  09/21/2005LH                                 DISCHARGE SUMMARY   This 50 year old male was admitted to the hospital with rectal bleeding.  He  had an uncomplicated four day hospital course.   His admission H&H was 14.2/41.2.  This drifted down to 12.5/36.7 two days  later but the bleeding had stopped at that time.  His MET-7, PT PTT, and  LFTs were normal.   Treatment in the hospital included Protonix 40 mg IV every day, Ambien 10 mg  q.h.s. p.r.n. sleep, and IV normal saline at 100 cc/hr.  He was treated with  Dulcolax and GoLYTELY prior to his procedure.  He had EGD and TCS through  Dr. Karilyn Cota, gastroenterologist, on his third day.  His EGD was essentially  normal and his TCS showed a small rectal polyp which was biopsied and  excessive hemorrhoids.  He also required Dilaudid and ibuprofen for cluster  headaches while in the hospital.   He was stable and ready for discharge home by his fourth day, October 03, 2003.  Vital signs were stable.  Hemoglobin was good.   He is discharged home on:  1.  Alprazolam 2 mg t.i.d. (100 no refills).  2.  Prilosec 20 mg every day (34, refills x 11).   Followup is in the office in a week.   FINAL DISCHARGE DIAGNOSES:  1.  Rectal bleeding secondary to hemorrhoids.  2.  Rectosigmoid polyp, biopsy pending.  3.  Chronic anxiety.  4.  History of cluster headaches.  5.  Status post left ankle fracture.   The time spent with this patient today reviewing his paper medical record,  electronic medical record, talking to the patient and his significant other  in the room, formulating a plan, dictating a note was 35 minutes.      SDK/MEDQ  D:   10/03/2003  T:  10/03/2003  Job:  [id number]

## 2010-10-09 LAB — DIFFERENTIAL
Basophils Absolute: 0
Lymphocytes Relative: 25
Monocytes Absolute: 0.7
Monocytes Relative: 9
Neutro Abs: 4.9

## 2010-10-09 LAB — BASIC METABOLIC PANEL
Calcium: 9.3
GFR calc Af Amer: >60
GFR calc non Af Amer: >60
Sodium: 140

## 2010-10-09 LAB — CBC
Hemoglobin: 14.6
RBC: 4.94
RDW: 13.7
WBC: 7.6

## 2010-10-09 LAB — POCT CARDIAC MARKERS
CKMB, poc: <1 — ABNORMAL LOW
Troponin i, poc: <0.05

## 2010-10-13 LAB — CBC
HCT: 45.1
Hemoglobin: 15.5
MCHC: 34.3
MCV: 87.8
RBC: 5.14
RDW: 14.8

## 2010-10-13 LAB — POCT CARDIAC MARKERS: Troponin i, poc: <0.05

## 2010-10-13 LAB — RAPID URINE DRUG SCREEN, HOSP PERFORMED
Amphetamines: NOT DETECTED
Barbiturates: NOT DETECTED
Opiates: NOT DETECTED

## 2010-10-13 LAB — DIFFERENTIAL
Basophils Absolute: 0
Basophils Relative: 1
Eosinophils Absolute: 0.2
Eosinophils Relative: 4
Monocytes Absolute: 0.3
Monocytes Relative: 7
Neutro Abs: 3.3

## 2010-10-13 LAB — URINALYSIS, ROUTINE W REFLEX MICROSCOPIC
Bilirubin Urine: NEGATIVE
Leukocytes, UA: NEGATIVE
Protein, ur: NEGATIVE
Specific Gravity, Urine: <1.005 — ABNORMAL LOW

## 2010-10-13 LAB — BASIC METABOLIC PANEL
CO2: 25
Chloride: 103
GFR calc Af Amer: >60
Glucose, Bld: 107 — ABNORMAL HIGH
Potassium: 3.6
Sodium: 137

## 2010-10-13 LAB — URINE MICROSCOPIC-ADD ON

## 2010-10-24 LAB — URINALYSIS, ROUTINE W REFLEX MICROSCOPIC
Ketones, ur: NEGATIVE
Nitrite: NEGATIVE
pH: 7

## 2010-10-24 LAB — DIFFERENTIAL
Eosinophils Absolute: 0.2
Eosinophils Relative: 3
Lymphs Abs: 1.6
Monocytes Absolute: 0.5
Monocytes Relative: 7

## 2010-10-24 LAB — RAPID URINE DRUG SCREEN, HOSP PERFORMED
Barbiturates: NOT DETECTED
Cocaine: NOT DETECTED

## 2010-10-24 LAB — BASIC METABOLIC PANEL
Chloride: 101
GFR calc Af Amer: >60
Potassium: 3.9

## 2010-10-24 LAB — HEPATIC FUNCTION PANEL
Alkaline Phosphatase: 48
Indirect Bilirubin: 0.9
Total Bilirubin: 1

## 2010-10-24 LAB — CBC
HCT: 43.1
MCV: 87.8
RBC: 4.91
WBC: 7.1

## 2010-10-24 LAB — ETHANOL: Alcohol, Ethyl (B): <5

## 2010-12-13 ENCOUNTER — Emergency Department (HOSPITAL_COMMUNITY): Payer: Medicaid Other

## 2010-12-13 ENCOUNTER — Emergency Department (HOSPITAL_COMMUNITY)
Admission: EM | Admit: 2010-12-13 | Discharge: 2010-12-13 | Disposition: A | Discharge status: Home / Self Care | Payer: Medicaid Other | Attending: Emergency Medicine | Admitting: Emergency Medicine

## 2010-12-13 DIAGNOSIS — J111 Influenza due to unidentified influenza virus with other respiratory manifestations: Secondary | ICD-10-CM | POA: Insufficient documentation

## 2010-12-13 DIAGNOSIS — J329 Chronic sinusitis, unspecified: Secondary | ICD-10-CM | POA: Insufficient documentation

## 2010-12-13 DIAGNOSIS — IMO0002 Reserved for concepts with insufficient information to code with codable children: Secondary | ICD-10-CM | POA: Insufficient documentation

## 2010-12-13 DIAGNOSIS — F411 Generalized anxiety disorder: Secondary | ICD-10-CM | POA: Insufficient documentation

## 2010-12-13 HISTORY — DX: Anxiety disorder, unspecified: F41.9

## 2010-12-13 HISTORY — DX: Reserved for concepts with insufficient information to code with codable children: IMO0002

## 2010-12-13 LAB — CBC
HCT: 38.2 % — ABNORMAL LOW (ref 39.0–52.0)
Hemoglobin: 12.7 g/dL — ABNORMAL LOW (ref 13.0–17.0)
MCHC: 33.2 g/dL (ref 30.0–36.0)

## 2010-12-13 MED ORDER — SODIUM CHLORIDE 0.9 % IV BOLUS (SEPSIS)
1000.0000 mL | Freq: Once | INTRAVENOUS | Status: AC
  Administered 2010-12-13: 1000 mL via INTRAVENOUS

## 2010-12-13 MED ORDER — SODIUM CHLORIDE 0.9 % IV SOLN
INTRAVENOUS | Status: DC
  Administered 2010-12-13: 14:00:00 via INTRAVENOUS

## 2010-12-13 MED ORDER — SODIUM CHLORIDE 0.9 % IV SOLN: INTRAVENOUS | Status: DC

## 2010-12-13 MED ORDER — AZITHROMYCIN 250 MG PO TABS: 250.0000 mg | ORAL_TABLET | Freq: Every day | ORAL | Status: AC

## 2010-12-13 NOTE — ED Notes (Signed)
Pt states is feeling better. Would like to know if he is going home anytime soon.

## 2010-12-13 NOTE — ED Notes (Signed)
Was told by [facility] in the lab that the first cbc result was not correct, to look for the second corrected result.  Notified Dr. Deretha Emory.

## 2010-12-13 NOTE — ED Provider Notes (Signed)
History  This chart was scribed for Shelda Jakes, MD by Bennett Scrape. This patient was seen in room [facility] and the patient's care was started at 3:27PM.  CSN: [id number] Arrival date & time: 12/13/2010  1:53 PM   First MD Initiated Contact with Patient 12/13/10 1505      Chief Complaint  Patient presents with  . Weakness  . URI    Patient is a 50 y.o. male presenting with pharyngitis. The history is provided by the patient. No language interpreter was used.  Sore Throat This is a new problem. The current episode started more than 1 week ago. The problem occurs constantly. Associated symptoms include chest pain, headaches and shortness of breath. Pertinent negatives include no abdominal pain. The symptoms are relieved by medications. The treatment provided mild relief.   [name] is a 50 y.o. male who presents to the Emergency Department complaining of one week of constant, gradually worsening sore throat with associated non-productive coughing, chest pain described as tightness and a headache described as a pressure feeling located behinds the eyes. Pt reports that he has been using tussionex for the past week with mild improvement in his symptoms. Pt also c/o SOB, myalgias, fever, chills, nasal congestion, lightheadedness and visual disturbances described as blurred vision and generalized weakness. Pt reports that he has had 2 flu shots this year.   Past Medical History  Diagnosis Date  . Anxiety   . Degenerative disc disease     Past Surgical History  Procedure Date  . Appendectomy   . Ankle surgery   . Finger surgery     No family history on file.  History  Substance Use Topics  . Smoking status: Not on file  . Smokeless tobacco: Current User  . Alcohol Use: No      Review of Systems  Constitutional: Positive for fever, chills and fatigue.  HENT: Positive for congestion and sore throat.   Eyes: Positive for visual disturbance. Negative for  redness.  Respiratory: Positive for cough and shortness of breath.   Cardiovascular: Positive for chest pain.  Gastrointestinal: Negative for nausea, vomiting, abdominal pain, diarrhea and blood in stool.  Genitourinary: Negative for dysuria and hematuria.  Musculoskeletal: Positive for myalgias. Negative for back pain.  Skin: Negative for rash.  Neurological: Positive for weakness, light-headedness and headaches.  Hematological: Does not bruise/bleed easily.    Allergies  Review of patient's allergies indicates no known allergies.  Home Medications   Current Outpatient Rx  Name Route Sig Dispense Refill  . ALPRAZOLAM 2 MG PO TABS Oral Take 2 mg by mouth 4 (four) times daily.      . GUAIFENESIN 100 MG/5ML PO LIQD Oral Take 200 mg by mouth 3 (three) times daily as needed. For cough     . HYDROCODONE-ACETAMINOPHEN 10-325 MG PO TABS Oral Take 1-2 tablets by mouth every 6 (six) hours as needed. For pain     . PREDNISOLONE 5 MG PO TABS Oral Take 5 mg by mouth daily.      . TRAZODONE HCL 50 MG PO TABS Oral Take 150 mg by mouth at bedtime. For sleep     . AZITHROMYCIN 250 MG PO TABS Oral Take 1 tablet (250 mg total) by mouth daily. Take first 2 tablets together, then 1 every day until finished. 6 tablet 0    Triage Vitals: BP 100/71  Pulse 72  Temp 98 F (36.7 C)  Resp 18  Ht 6' (1.829 m)  Wt 184  lb (83.462 kg)  BMI 24.95 kg/m2  SpO2 97%  Physical Exam  Nursing note and vitals reviewed. Constitutional: He is oriented to person, place, and time. He appears well-developed and well-nourished.  HENT:  Head: Normocephalic and atraumatic.  Mouth/Throat: Oropharynx is clear and moist. No oropharyngeal exudate.       Erythema of the pharynx, more so on the right side  Eyes: Conjunctivae and EOM are normal.  Neck: Neck supple.  Cardiovascular: Normal rate, regular rhythm and normal heart sounds.   Pulmonary/Chest: Effort normal and breath sounds normal.  Abdominal: Soft. Bowel sounds  are normal. He exhibits no distension.  Musculoskeletal: Normal range of motion. He exhibits no edema.  Lymphadenopathy:    He has no cervical adenopathy.  Neurological: He is alert and oriented to person, place, and time.       Strength is normal in all extremities  Skin: Skin is warm and dry. No rash noted.  Psychiatric: He has a normal mood and affect. His behavior is normal.    ED Course  Procedures (including critical care time)  DIAGNOSTIC STUDIES:   COORDINATION OF CARE: 3:32PM-Discussed treatment plan with pt at bedside and pt agreed to plan.   Date: 12/13/2010  Rate: 68  Rhythm: normal sinus rhythm  QRS Axis: normal  Intervals: normal  ST/T Wave abnormalities: normal  Conduction Disutrbances:none  Narrative Interpretation:   Old EKG Reviewed: unchanged EKG unchanged from 07/19/2007  THE CBC COLLECTED AT 4:00PM WAS A MISLABELED SPECIMEN  Results for orders placed during the hospital encounter of 12/13/10  COMPREHENSIVE METABOLIC PANEL      Component Value Range   Sodium 138  135 - 145 (mEq/L)   Potassium 3.3 (*) 3.5 - 5.1 (mEq/L)   Chloride 97  96 - 112 (mEq/L)   CO2 31  19 - 32 (mEq/L)   Glucose, Bld 122 (*) 70 - 99 (mg/dL)   BUN 15  6 - 23 (mg/dL)   Creatinine, Ser 6.29 (*) 0.50 - 1.35 (mg/dL)   Calcium 9.6  8.4 - 52.8 (mg/dL)   Total Protein 8.2  6.0 - 8.3 (g/dL)   Albumin 3.7  3.5 - 5.2 (g/dL)   AST 14  0 - 37 (U/L)   ALT 9  0 - 53 (U/L)   Alkaline Phosphatase 61  39 - 117 (U/L)   Total Bilirubin 0.6  0.3 - 1.2 (mg/dL)   GFR calc non Af Amer 12 (*) >90 (mL/min)   GFR calc Af Amer 14 (*) >90 (mL/min)  CBC      Component Value Range   WBC 8.8  4.0 - 10.5 (K/uL)   RBC 4.21 (*) 4.22 - 5.81 (MIL/uL)   Hemoglobin 12.7 (*) 13.0 - 17.0 (g/dL)   HCT 41.3 (*) 24.4 - 52.0 (%)   MCV 90.7  78.0 - 100.0 (fL)   MCH 30.2  26.0 - 34.0 (pg)   MCHC 33.2  30.0 - 36.0 (g/dL)   RDW 01.0  27.2 - 53.6 (%)   Platelets 135 (*) 150 - 400 (K/uL)  RAPID STREP SCREEN       Component Value Range   Streptococcus, Group A Screen (Direct) NEGATIVE  NEGATIVE    Dg Chest 2 View  12/13/2010  *RADIOLOGY REPORT*  Clinical Data: Chest pain, cough  CHEST - 2 VIEW  Comparison:  10/10/2008  Findings:  The heart size and mediastinal contours are within normal limits.  Both lungs are clear.  The visualized skeletal structures are unremarkable.  IMPRESSION: No  active cardiopulmonary disease.  Original Report Authenticated By: Judie Petit. Ruel Favors, M.D.   Ct Head Wo Contrast  12/13/2010  *RADIOLOGY REPORT*  Clinical Data: Weakness with pharyngitis.  CT HEAD WITHOUT CONTRAST  Technique:  Contiguous axial images were obtained from the base of the skull through the vertex without contrast.  Comparison: 04/23/2009.  Findings: There is no evidence for acute infarction, intracranial hemorrhage, mass lesion, hydrocephalus, or extra-axial fluid. There is mild atrophy with mild chronic microvascular ischemic change.  The calvarium is intact. There is a small air-fluid level in the right maxillary sinus.  Chronic mucosal thickening is seen throughout the ethmoids.  A similar appearance was present on the previous exam except for sinusitis which has progressed.  IMPRESSION: Mild atrophy and small vessel disease.  No acute intracranial findings.  Interval development of acute right maxillary and chronic ethmoid sinusitis.  Original Report Authenticated By: Elsie Stain, M.D.    1. Influenza   2. Sinus infection       MDM  Patient still is consistent with influenza duration the courses beyond any benefit from Tamiflu CT reveals acute and chronic sinusitis the acute component can be treated with antibiotic. Followup with your primary care Dr. if not improved in 2 days or earlier if worse.     I personally performed the services described in this documentation, which was scribed in my presence. The recorded information has been reviewed and considered.     Shelda Jakes, MD 12/13/10  [phone]

## 2010-12-13 NOTE — ED Notes (Signed)
Pt states has had cold like symptoms x 1 week, c/o generalized weakness, and pain.  Pt states has a headache, sore throat, cough, runny nose and generalized malaise.

## 2010-12-13 NOTE — ED Notes (Signed)
Pt reports sore throat, cough, generalized weakness, and lightheadedness x 1 week.  Reports has been taking tussionex for cough.  Pt also reports some R arm pain and blurry vision since yesterday.

## 2010-12-14 LAB — COMPREHENSIVE METABOLIC PANEL
ALT: 22 U/L (ref 0–53)
AST: 30 U/L (ref 0–37)
Calcium: 9.4 mg/dL (ref 8.4–10.5)
GFR calc Af Amer: >90 mL/min (ref >90)
Glucose, Bld: 97 mg/dL (ref 70–99)
Sodium: 137 mEq/L (ref 135–145)
Total Protein: 6.3 g/dL (ref 6.0–8.3)

## 2011-02-03 ENCOUNTER — Emergency Department (HOSPITAL_COMMUNITY): Payer: Medicaid Other

## 2011-02-03 ENCOUNTER — Encounter (HOSPITAL_COMMUNITY): Payer: Self-pay | Admitting: Emergency Medicine

## 2011-02-03 ENCOUNTER — Emergency Department (HOSPITAL_COMMUNITY)
Admission: EM | Admit: 2011-02-03 | Discharge: 2011-02-03 | Disposition: A | Discharge status: Home / Self Care | Payer: Medicaid Other | Attending: Emergency Medicine | Admitting: Emergency Medicine

## 2011-02-03 DIAGNOSIS — IMO0002 Reserved for concepts with insufficient information to code with codable children: Secondary | ICD-10-CM | POA: Insufficient documentation

## 2011-02-03 DIAGNOSIS — W19XXXA Unspecified fall, initial encounter: Secondary | ICD-10-CM

## 2011-02-03 DIAGNOSIS — F411 Generalized anxiety disorder: Secondary | ICD-10-CM | POA: Insufficient documentation

## 2011-02-03 DIAGNOSIS — S0083XA Contusion of other part of head, initial encounter: Secondary | ICD-10-CM

## 2011-02-03 DIAGNOSIS — Z79899 Other long term (current) drug therapy: Secondary | ICD-10-CM | POA: Insufficient documentation

## 2011-02-03 DIAGNOSIS — R42 Dizziness and giddiness: Secondary | ICD-10-CM | POA: Insufficient documentation

## 2011-02-03 DIAGNOSIS — H81399 Other peripheral vertigo, unspecified ear: Secondary | ICD-10-CM

## 2011-02-03 DIAGNOSIS — F17200 Nicotine dependence, unspecified, uncomplicated: Secondary | ICD-10-CM | POA: Insufficient documentation

## 2011-02-03 MED ORDER — MECLIZINE HCL 12.5 MG PO TABS
25.0000 mg | ORAL_TABLET | Freq: Once | ORAL | Status: AC
  Administered 2011-02-03: 25 mg via ORAL
  Filled 2011-02-03: qty 2

## 2011-02-03 MED ORDER — MECLIZINE HCL 25 MG PO TABS: 25.0000 mg | ORAL_TABLET | Freq: Three times a day (TID) | ORAL | Status: AC | PRN

## 2011-02-03 NOTE — ED Notes (Signed)
Pt states he fell January 17 th, was seen at [facility],  received  Sutures in left jaw. Pt reports dizziness since the accident.  Pt reports no changes in medication.  Pt has rt black eye in different stages of healing, and approximated/healed suture line in lt jaw. Pt denies other injuries at that time. Pt states he cannot bend over at all, the room spins and he feels faint.

## 2011-02-03 NOTE — ED Provider Notes (Signed)
History     CSN: [id number]  Arrival date & time 02/03/11  1812   Chief Complaint  Patient presents with  . Fall  . Dizziness   HPI Pt was seen at 1950.  Per pt, c/o gradual onset and persistence of multiple intermittent episodes of "dizziness" when he moves his head side-to-side that began after he fell on 01/29/11.  Describes the dizziness as "the room is spinning."  Pt was eval at [facility] ED and "got stitches in my face," no CT; describes the fall as he tripped and fell over a dog and hit a TV and fishtank with his face.  Denies LOC, no AMS/confusion, no fevers, no CP/SOB, no abd pain, no neck or back pain, no focal motor weakness, no tingling/numbness in extremities, no visual changes.  Endorses he was recently eval by the ED and f/u with his PMD for a URI in the past month.     Past Medical History  Diagnosis Date  . Anxiety   . Degenerative disc disease     Past Surgical History  Procedure Date  . Appendectomy   . Ankle surgery   . Finger surgery    History  Substance Use Topics  . Smoking status: Not on file  . Smokeless tobacco: Current User  . Alcohol Use: No     Review of Systems ROS: Statement: All systems negative except as marked or noted in the HPI; Constitutional: Negative for fever and chills. ; ; Eyes: Negative for eye pain, redness and discharge. ; ; ENMT: Negative for ear pain, hoarseness, nasal congestion, sinus pressure and sore throat. ; ; Cardiovascular: Negative for chest pain, palpitations, diaphoresis, dyspnea and peripheral edema. ; ; Respiratory: Negative for cough, wheezing and stridor. ; ; Gastrointestinal: Negative for nausea, vomiting, diarrhea, abdominal pain, blood in stool, hematemesis, jaundice and rectal bleeding. . ; ; Genitourinary: Negative for dysuria, flank pain and hematuria. ; ; Musculoskeletal: Negative for back pain and neck pain. Negative for swelling and trauma.; ; Skin: +bruising. Negative for pruritus, rash, abrasions, blisters,  and skin lesion.; ; Neuro: +dizziness. Negative for headache, lightheadedness and neck stiffness. Negative for weakness, altered level of consciousness , altered mental status, extremity weakness, paresthesias, involuntary movement, seizure and syncope.    Allergies  Penicillins  Home Medications   Current Outpatient Rx  Name Route Sig Dispense Refill  . ALPRAZOLAM 2 MG PO TABS Oral Take 2 mg by mouth 4 (four) times daily.      Marland Kitchen HYDROCODONE-ACETAMINOPHEN 10-325 MG PO TABS Oral Take 1-2 tablets by mouth every 6 (six) hours as needed. For pain     . TRAZODONE HCL 50 MG PO TABS Oral Take 150 mg by mouth at bedtime. For sleep       BP 132/88  Pulse 94  Temp(Src) 97.7 F (36.5 C) (Oral)  Resp 20  Ht 6' (1.829 m)  Wt 184 lb (83.462 kg)  BMI 24.95 kg/m2  SpO2 100%  Physical Exam 1955: Physical examination:  Nursing notes reviewed; Vital signs and O2 SAT reviewed;  Constitutional: Well developed, Well nourished, Well hydrated, In no acute distress; Head:  Normocephalic, +ecchymosis below right eye, +healing linear wound with sutures left chin.; Eyes: EOMI, PERRL, No scleral icterus, no conjunctival injection; ENMT: TM's clear bilat. Mouth and pharynx normal, poor dentition, Mucous membranes moist; Neck: Supple, Full range of motion, No lymphadenopathy; Cardiovascular: Regular rate and rhythm, No murmur, rub, or gallop; Respiratory: Breath sounds clear & equal bilaterally, No rales, rhonchi, wheezes, or  rub, Normal respiratory effort/excursion; Chest: Nontender, Movement normal; Abdomen: Soft, Nontender, Nondistended, Normal bowel sounds; Spine:  No midline CS, TS, LS tenderness.;; Extremities: Pulses normal, No tenderness, No edema, No calf edema or asymmetry.; Neuro: AA&Ox3, Major CN grossly intact. No facial droop, speech clear. +right horizontal gaze fatigable nystagmus which reproduces pt's symptoms. Gait steady, normal coordination, No gross focal motor or sensory deficits in extremities.;  Skin: Color normal, Warm, Dry, no rash.     ED Course  Procedures    MDM  MDM Reviewed: nursing note and vitals Interpretation: CT scan    Ct Head Wo Contrast 02/03/2011  *RADIOLOGY REPORT*  Clinical Data:  Fall  CT HEAD WITHOUT CONTRAST CT MAXILLOFACIAL WITHOUT CONTRAST  Technique:  Multidetector CT imaging of the head and maxillofacial structures were performed using the standard protocol without intravenous contrast. Multiplanar CT image reconstructions of the maxillofacial structures were also generated.  Comparison:  12/13/2010  CT HEAD  Findings: No mass effect, midline shift, or acute intracranial hemorrhage.  Mastoid air cells and visualized paranasal sinuses are clear.  Cranium is intact.  Mild chronic ischemic changes in the periventricular white matter.  IMPRESSION: No acute intracranial pathology.  CT MAXILLOFACIAL  Findings:   Caries are seen involving several teeth.  Right upper and lower third molars are impacted.  No evidence of fracture or dislocation.  Specifically, there is no fracture involving the left side of the mandible or the right orbital region.  Nasal septum is deviated to the left.  IMPRESSION: No evidence of acute injury in the facial bones.  Dental disease.  Original Report Authenticated By: Donavan Burnet, M.D.   Ct Maxillofacial Wo Cm 02/03/2011  *RADIOLOGY REPORT*  Clinical Data:  Fall  CT HEAD WITHOUT CONTRAST CT MAXILLOFACIAL WITHOUT CONTRAST  Technique:  Multidetector CT imaging of the head and maxillofacial structures were performed using the standard protocol without intravenous contrast. Multiplanar CT image reconstructions of the maxillofacial structures were also generated.  Comparison:  12/13/2010  CT HEAD  Findings: No mass effect, midline shift, or acute intracranial hemorrhage.  Mastoid air cells and visualized paranasal sinuses are clear.  Cranium is intact.  Mild chronic ischemic changes in the periventricular white matter.  IMPRESSION: No acute  intracranial pathology.  CT MAXILLOFACIAL  Findings:   Caries are seen involving several teeth.  Right upper and lower third molars are impacted.  No evidence of fracture or dislocation.  Specifically, there is no fracture involving the left side of the mandible or the right orbital region.  Nasal septum is deviated to the left.  IMPRESSION: No evidence of acute injury in the facial bones.  Dental disease.  Original Report Authenticated By: Donavan Burnet, M.D.    10:22 PM:  States he feels "much better now" after antivert, has gotten himself dressed, and wants to go home.  Has been ambulatory in ED with steady gait, easy resps.  Has tol PO well while in ED without N/V.  Neuro exam continues unchanged.  Dx testing d/w pt.  Questions answered.  Verb understanding, agreeable to d/c home with outpt f/u.        Gillie Crisci Allison Quarry, DO 02/04/11 1433

## 2011-02-03 NOTE — ED Notes (Signed)
Pt states he fell on the 17th and since then he has been dizzy.

## 2011-02-03 NOTE — ED Notes (Signed)
Pt states feels better , is less dizzy after taking medication.  Dr Alwyn Pea notified.

## 2011-02-07 ENCOUNTER — Encounter (HOSPITAL_COMMUNITY): Payer: Self-pay

## 2011-02-07 ENCOUNTER — Emergency Department (HOSPITAL_COMMUNITY)
Admission: EM | Admit: 2011-02-07 | Discharge: 2011-02-07 | Disposition: A | Discharge status: Home / Self Care | Payer: Medicaid Other | Attending: Emergency Medicine | Admitting: Emergency Medicine

## 2011-02-07 DIAGNOSIS — Z4802 Encounter for removal of sutures: Secondary | ICD-10-CM | POA: Insufficient documentation

## 2011-02-07 DIAGNOSIS — IMO0002 Reserved for concepts with insufficient information to code with codable children: Secondary | ICD-10-CM

## 2011-02-07 NOTE — ED Provider Notes (Signed)
History     CSN: [id number]  Arrival date & time 02/07/11  1227   First MD Initiated Contact with Patient 02/07/11 1242      Chief Complaint  Patient presents with  . Suture / Staple Removal    (Consider location/radiation/quality/duration/timing/severity/associated sxs/prior treatment) HPI Comments: Patient here with suture removal of 7 sutures to left cheek - no redness or drainage noted.  Patient is a 51 y.o. male presenting with suture removal. The history is provided by the patient. No language interpreter was used.  Suture / Staple Removal  The sutures were placed 7 to 10 days ago. Treatments since wound repair include regular soap and water washings. Fever duration: none. There has been no drainage from the wound. There is no redness present. There is no swelling present. The pain has no pain. He has no difficulty moving the affected extremity or digit.    Past Medical History  Diagnosis Date  . Anxiety   . Degenerative disc disease     Past Surgical History  Procedure Date  . Appendectomy   . Ankle surgery   . Finger surgery     No family history on file.  History  Substance Use Topics  . Smoking status: Not on file  . Smokeless tobacco: Current User  . Alcohol Use: No      Review of Systems  All other systems reviewed and are negative.    Allergies  Penicillins  Home Medications   Current Outpatient Rx  Name Route Sig Dispense Refill  . ALPRAZOLAM 2 MG PO TABS Oral Take 2 mg by mouth 4 (four) times daily.      Marland Kitchen HYDROCODONE-ACETAMINOPHEN 10-325 MG PO TABS Oral Take 1-2 tablets by mouth every 6 (six) hours as needed. For pain     . HYDROGEN PEROXIDE 3 % EX SOLN Oral Take 1 application by mouth 3 (three) times daily as needed. For stiches in mouth. Swish and Spit    . MECLIZINE HCL 25 MG PO TABS Oral Take 1 tablet (25 mg total) by mouth 3 (three) times daily as needed. 15 tablet 0  . PREDNISONE 10 MG PO TABS Oral Take 10 mg by mouth daily as  needed. For bone disease    . TRAZODONE HCL 50 MG PO TABS Oral Take 150 mg by mouth at bedtime. For sleep       BP 131/83  Pulse 99  Temp(Src) 97.9 F (36.6 C) (Oral)  Resp 20  Ht 6' (1.829 m)  Wt 184 lb (83.462 kg)  BMI 24.95 kg/m2  SpO2 100%  Physical Exam  Nursing note and vitals reviewed. Constitutional: He is oriented to person, place, and time. He appears well-developed and well-nourished. No distress.  HENT:  Head: Normocephalic.  Right Ear: External ear normal.  Left Ear: External ear normal.  Nose: Nose normal.  Mouth/Throat: Oropharynx is clear and moist. No oropharyngeal exudate.       Healing suture line to left cheek  Eyes: Conjunctivae are normal. Pupils are equal, round, and reactive to light. No scleral icterus.  Neck: Normal range of motion. Neck supple.  Cardiovascular: Normal rate, regular rhythm and normal heart sounds.  Exam reveals no gallop and no friction rub.   No murmur heard. Pulmonary/Chest: Effort normal and breath sounds normal. No respiratory distress.  Abdominal: Soft. Bowel sounds are normal. There is no tenderness.  Musculoskeletal: Normal range of motion.  Lymphadenopathy:    He has no cervical adenopathy.  Neurological: He is alert and  oriented to person, place, and time. No cranial nerve deficit.  Skin: Skin is warm and dry. No rash noted. No erythema. No pallor.  Psychiatric: He has a normal mood and affect. His behavior is normal. Judgment and thought content normal.    ED Course  Procedures (including critical care time)  Labs Reviewed - No data to display No results found. SUTURE REMOVAL Performed by: Cherrie Distance C.  Consent: Verbal consent obtained. Patient identity confirmed: provided demographic data Time out: Immediately prior to procedure a "time out" was called to verify the correct patient, procedure, equipment, support staff and site/side marked as required.  Location details: left cheek  Wound Appearance:  clean  Sutures/Staples Removed: 5  Facility: sutures placed in this facility Patient tolerance: Patient tolerated the procedure well with no immediate complications.   Suture removal    MDM  Removal of 5 sutures - though patient noted 7, I have only able to see and remove 5        Scarlette Calico C. [place], [place] 02/07/11 1256

## 2011-02-07 NOTE — ED Notes (Signed)
Pt presents to ED secondary to need of suture removal.  Sutures were placed on 29 January 2011 per pt. Pt has healed, well approximated old laceration site on left lower jaw. No s/s of infection.

## 2011-02-07 NOTE — ED Notes (Signed)
Pt presents with suture to left side of cheek. Pt states they were placed on 01/29/2011 at [facility].

## 2011-02-09 ENCOUNTER — Emergency Department (HOSPITAL_COMMUNITY)
Admission: EM | Admit: 2011-02-09 | Discharge: 2011-02-09 | Disposition: A | Discharge status: Home / Self Care | Payer: Medicaid Other | Attending: Emergency Medicine | Admitting: Emergency Medicine

## 2011-02-09 ENCOUNTER — Encounter (HOSPITAL_COMMUNITY): Payer: Self-pay

## 2011-02-09 ENCOUNTER — Emergency Department (HOSPITAL_COMMUNITY): Payer: Medicaid Other

## 2011-02-09 DIAGNOSIS — F411 Generalized anxiety disorder: Secondary | ICD-10-CM | POA: Insufficient documentation

## 2011-02-09 DIAGNOSIS — L03211 Cellulitis of face: Secondary | ICD-10-CM | POA: Insufficient documentation

## 2011-02-09 DIAGNOSIS — IMO0002 Reserved for concepts with insufficient information to code with codable children: Secondary | ICD-10-CM | POA: Insufficient documentation

## 2011-02-09 DIAGNOSIS — M1990 Unspecified osteoarthritis, unspecified site: Secondary | ICD-10-CM | POA: Insufficient documentation

## 2011-02-09 DIAGNOSIS — L0201 Cutaneous abscess of face: Secondary | ICD-10-CM

## 2011-02-09 HISTORY — DX: Unspecified osteoarthritis, unspecified site: M19.90

## 2011-02-09 MED ORDER — CLINDAMYCIN HCL 150 MG PO CAPS
300.0000 mg | ORAL_CAPSULE | Freq: Once | ORAL | Status: AC
  Administered 2011-02-09: 300 mg via ORAL
  Filled 2011-02-09: qty 2

## 2011-02-09 MED ORDER — CLINDAMYCIN HCL 300 MG PO CAPS: 300.0000 mg | ORAL_CAPSULE | Freq: Four times a day (QID) | ORAL | Status: AC

## 2011-02-09 NOTE — ED Provider Notes (Signed)
Medical screening examination/treatment/procedure(s) were performed by non-physician practitioner and as supervising physician I was immediately available for consultation/collaboration.  Cornelius Marullo, MD 02/09/11 1045 

## 2011-02-09 NOTE — ED Notes (Signed)
Pt left for CT.

## 2011-02-09 NOTE — ED Notes (Signed)
Per Dr. Preston Fleeting ok for pt to drink. Coke given to pt.

## 2011-02-09 NOTE — ED Provider Notes (Signed)
History     CSN: [id number]  Arrival date & time 02/09/11  4098   First MD Initiated Contact with Patient 02/09/11 0745      Chief Complaint  Patient presents with  . Wound Infection    (Consider location/radiation/quality/duration/timing/severity/associated sxs/prior treatment) The history is provided by the patient.   51 year old male had sutures placed on his left cheek 11 days ago. Sutures had been removed and he had been doing well until last night when he noted swelling in that area. He was able to express pus from the wound. He states it does with continued to reaccumulate. Pain is mild he rates it at 3/10. He has not had any fever, chills, sweats. Symptoms are moderate.  Past Medical History  Diagnosis Date  . Anxiety   . Degenerative disc disease   . Degenerative joint disease     Past Surgical History  Procedure Date  . Appendectomy   . Ankle surgery   . Finger surgery     No family history on file.  History  Substance Use Topics  . Smoking status: Not on file  . Smokeless tobacco: Current User  . Alcohol Use: No      Review of Systems  All other systems reviewed and are negative.    Allergies  Penicillins  Home Medications   Current Outpatient Rx  Name Route Sig Dispense Refill  . ALPRAZOLAM 2 MG PO TABS Oral Take 2 mg by mouth 4 (four) times daily.      Marland Kitchen HYDROCODONE-ACETAMINOPHEN 10-325 MG PO TABS Oral Take 1-2 tablets by mouth every 6 (six) hours as needed. For pain     . HYDROGEN PEROXIDE 3 % EX SOLN Oral Take 1 application by mouth 3 (three) times daily as needed. For stiches in mouth. Swish and Spit    . MECLIZINE HCL 25 MG PO TABS Oral Take 1 tablet (25 mg total) by mouth 3 (three) times daily as needed. 15 tablet 0  . PREDNISONE 10 MG PO TABS Oral Take 10 mg by mouth daily as needed. For bone disease    . TRAZODONE HCL 50 MG PO TABS Oral Take 150 mg by mouth at bedtime. For sleep       BP 137/90  Pulse 84  Temp(Src) 98.1 F (36.7 C)  (Oral)  Resp 18  Ht 6' (1.829 m)  Wt 184 lb (83.462 kg)  BMI 24.95 kg/m2  SpO2 99%  Physical Exam  Nursing note and vitals reviewed.  51 year old male who is resting comfortably and in no acute distress. Vital signs are normal. Oxygen saturation is 99% which is normal. Head is normocephalic and atraumatic. Wound on the left malar area is generally healed well but has swelling and erythema. A moderate amount of. Material is able to be expressed from the wound. The buccal mucosa aspect of the wound seems to be well-healed the medicine and is not open. I cannot palpate any definite foreign bodies. Oral dentition is poor but there are no obvious chipped teeth. Neck is supple without adenopathy. Lungs are clear without rales, wheezes, rhonchi. Heart has regular rate rhythm without murmur. Abdomen is soft, flat, nontender without masses or hepatosplenomegaly. Extremities have no cyanosis or edema, full range of motion is present. Skin is moist without other rash. Neurologic: Mental status is normal, cranial nerves are intact, there no focal motor or sensory deficits.  ED Course  Procedures (including critical care time)  Results for orders placed during the hospital encounter of 12/13/10  COMPREHENSIVE METABOLIC PANEL      Component Value Range   Sodium 137  135 - 145 (mEq/L)   Potassium 3.9  3.5 - 5.1 (mEq/L)   Chloride 105  96 - 112 (mEq/L)   CO2 26  19 - 32 (mEq/L)   Glucose, Bld 97  70 - 99 (mg/dL)   BUN 9  6 - 23 (mg/dL)   Creatinine, Ser 2.84  0.50 - 1.35 (mg/dL)   Calcium 9.4  8.4 - 13.2 (mg/dL)   Total Protein 6.3  6.0 - 8.3 (g/dL)   Albumin 3.7  3.5 - 5.2 (g/dL)   AST 30  0 - 37 (U/L)   ALT 22  0 - 53 (U/L)   Alkaline Phosphatase 77  39 - 117 (U/L)   Total Bilirubin 0.6  0.3 - 1.2 (mg/dL)   GFR calc non Af Amer >90  >90 (mL/min)   GFR calc Af Amer >90  >90 (mL/min)  CBC      Component Value Range   WBC 8.8  4.0 - 10.5 (K/uL)   RBC 4.21 (*) 4.22 - 5.81 (MIL/uL)   Hemoglobin  12.7 (*) 13.0 - 17.0 (g/dL)   HCT 44.0 (*) 10.2 - 52.0 (%)   MCV 90.7  78.0 - 100.0 (fL)   MCH 30.2  26.0 - 34.0 (pg)   MCHC 33.2  30.0 - 36.0 (g/dL)   RDW 72.5  36.6 - 44.0 (%)   Platelets 135 (*) 150 - 400 (K/uL)  RAPID STREP SCREEN      Component Value Range   Streptococcus, Group A Screen (Direct) NEGATIVE  NEGATIVE    Ct Head Wo Contrast  02/03/2011  *RADIOLOGY REPORT*  Clinical Data:  Fall  CT HEAD WITHOUT CONTRAST CT MAXILLOFACIAL WITHOUT CONTRAST  Technique:  Multidetector CT imaging of the head and maxillofacial structures were performed using the standard protocol without intravenous contrast. Multiplanar CT image reconstructions of the maxillofacial structures were also generated.  Comparison:  12/13/2010  CT HEAD  Findings: No mass effect, midline shift, or acute intracranial hemorrhage.  Mastoid air cells and visualized paranasal sinuses are clear.  Cranium is intact.  Mild chronic ischemic changes in the periventricular white matter.  IMPRESSION: No acute intracranial pathology.  CT MAXILLOFACIAL  Findings:   Caries are seen involving several teeth.  Right upper and lower third molars are impacted.  No evidence of fracture or dislocation.  Specifically, there is no fracture involving the left side of the mandible or the right orbital region.  Nasal septum is deviated to the left.  IMPRESSION: No evidence of acute injury in the facial bones.  Dental disease.  Original Report Authenticated By: Donavan Burnet, M.D.   Ct Maxillofacial Wo Cm  02/09/2011  *RADIOLOGY REPORT*  Clinical Data: Left cheek abscess.  Question foreign body.  CT MAXILLOFACIAL WITHOUT CONTRAST  Technique:  Multidetector CT imaging of the maxillofacial structures was performed. Multiplanar CT image reconstructions were also generated.  Comparison: 02/03/2011  Findings: Soft tissue swelling noted over the left lower face overlying the left side of the mandible.  This is an area of gas locules on previous study, presumably  related to facial laceration. No gas is present currently.  No radiopaque foreign bodies. Multiple dental caries noted as seen previously.  No periapical abscesses.  Both temporomandibular joints appear subluxed with possible dislocation of the left temporal mandibular joint.  Recommend clinical correlation.  IMPRESSION: Soft tissue swelling over the left side of the mandible.  No soft tissue  gas or radiopaque foreign body currently.  Multiple dental caries.  At least subluxation of the temporomandibular joints bilaterally, left worse than right.  Question left TMJ dislocation.  Original Report Authenticated By: Cyndie Chime, M.D.   Ct Maxillofacial Wo Cm  02/03/2011  *RADIOLOGY REPORT*  Clinical Data:  Fall  CT HEAD WITHOUT CONTRAST CT MAXILLOFACIAL WITHOUT CONTRAST  Technique:  Multidetector CT imaging of the head and maxillofacial structures were performed using the standard protocol without intravenous contrast. Multiplanar CT image reconstructions of the maxillofacial structures were also generated.  Comparison:  12/13/2010  CT HEAD  Findings: No mass effect, midline shift, or acute intracranial hemorrhage.  Mastoid air cells and visualized paranasal sinuses are clear.  Cranium is intact.  Mild chronic ischemic changes in the periventricular white matter.  IMPRESSION: No acute intracranial pathology.  CT MAXILLOFACIAL  Findings:   Caries are seen involving several teeth.  Right upper and lower third molars are impacted.  No evidence of fracture or dislocation.  Specifically, there is no fracture involving the left side of the mandible or the right orbital region.  Nasal septum is deviated to the left.  IMPRESSION: No evidence of acute injury in the facial bones.  Dental disease.  Original Report Authenticated By: Donavan Burnet, M.D.      1. Facial abscess     CT scan shows no foreign body. I will attempt to treat the abscess conservatively. He will be sent a prescription for clindamycin 300 mg 4  times a day for 7 days and followup with his PCP in 3 days.  MDM  Wound infection with abscess, need to rule out foreign body.        Dione Booze, MD 02/09/11 [phone]

## 2011-02-09 NOTE — ED Notes (Signed)
Pt reports had stitches removed 2 days ago and last night area was swollen.  Pt says squeezed the area and large amounts of white pus came out.

## 2011-05-03 ENCOUNTER — Emergency Department (HOSPITAL_COMMUNITY)
Admission: EM | Admit: 2011-05-03 | Discharge: 2011-05-03 | Disposition: A | Discharge status: Home / Self Care | Payer: Medicaid Other | Attending: Emergency Medicine | Admitting: Emergency Medicine

## 2011-05-03 ENCOUNTER — Other Ambulatory Visit: Payer: Self-pay

## 2011-05-03 ENCOUNTER — Encounter (HOSPITAL_COMMUNITY): Payer: Self-pay

## 2011-05-03 ENCOUNTER — Emergency Department (HOSPITAL_COMMUNITY): Payer: Medicaid Other

## 2011-05-03 DIAGNOSIS — IMO0002 Reserved for concepts with insufficient information to code with codable children: Secondary | ICD-10-CM | POA: Insufficient documentation

## 2011-05-03 DIAGNOSIS — R6883 Chills (without fever): Secondary | ICD-10-CM | POA: Insufficient documentation

## 2011-05-03 DIAGNOSIS — R10816 Epigastric abdominal tenderness: Secondary | ICD-10-CM | POA: Insufficient documentation

## 2011-05-03 DIAGNOSIS — K2970 Gastritis, unspecified, without bleeding: Secondary | ICD-10-CM | POA: Insufficient documentation

## 2011-05-03 DIAGNOSIS — M1990 Unspecified osteoarthritis, unspecified site: Secondary | ICD-10-CM | POA: Insufficient documentation

## 2011-05-03 DIAGNOSIS — R1013 Epigastric pain: Secondary | ICD-10-CM | POA: Insufficient documentation

## 2011-05-03 DIAGNOSIS — K2990 Gastroduodenitis, unspecified, without bleeding: Secondary | ICD-10-CM | POA: Insufficient documentation

## 2011-05-03 DIAGNOSIS — F411 Generalized anxiety disorder: Secondary | ICD-10-CM | POA: Insufficient documentation

## 2011-05-03 DIAGNOSIS — R112 Nausea with vomiting, unspecified: Secondary | ICD-10-CM | POA: Insufficient documentation

## 2011-05-03 DIAGNOSIS — Z79899 Other long term (current) drug therapy: Secondary | ICD-10-CM | POA: Insufficient documentation

## 2011-05-03 LAB — COMPREHENSIVE METABOLIC PANEL
Alkaline Phosphatase: 92 U/L (ref 39–117)
BUN: 9 mg/dL (ref 6–23)
CO2: 30 mEq/L (ref 19–32)
Calcium: 9.2 mg/dL (ref 8.4–10.5)
GFR calc Af Amer: 81 mL/min — ABNORMAL LOW (ref >90)
GFR calc non Af Amer: 70 mL/min — ABNORMAL LOW (ref >90)
Glucose, Bld: 82 mg/dL (ref 70–99)
Total Protein: 6.5 g/dL (ref 6.0–8.3)

## 2011-05-03 LAB — CBC
HCT: 43 % (ref 39.0–52.0)
Hemoglobin: 14 g/dL (ref 13.0–17.0)
MCH: 28.6 pg (ref 26.0–34.0)
MCV: 87.9 fL (ref 78.0–100.0)
RBC: 4.89 MIL/uL (ref 4.22–5.81)

## 2011-05-03 LAB — LIPASE, BLOOD: Lipase: 49 U/L (ref 11–59)

## 2011-05-03 LAB — DIFFERENTIAL
Eosinophils Absolute: 0.1 10*3/uL (ref 0.0–0.7)
Eosinophils Relative: 2 % (ref 0–5)
Lymphocytes Relative: 22 % (ref 12–46)
Lymphs Abs: 1.3 10*3/uL (ref 0.7–4.0)
Monocytes Relative: 9 % (ref 3–12)

## 2011-05-03 MED ORDER — RANITIDINE HCL 150 MG PO CAPS: 150.0000 mg | ORAL_CAPSULE | Freq: Two times a day (BID) | ORAL | Status: DC

## 2011-05-03 MED ORDER — SODIUM CHLORIDE 0.9 % IV SOLN
Freq: Once | INTRAVENOUS | Status: AC
  Administered 2011-05-03: 1000 mL via INTRAVENOUS

## 2011-05-03 MED ORDER — HYDROMORPHONE HCL PF 1 MG/ML IJ SOLN
1.0000 mg | Freq: Once | INTRAMUSCULAR | Status: AC
  Administered 2011-05-03: 1 mg via INTRAVENOUS
  Filled 2011-05-03: qty 1

## 2011-05-03 MED ORDER — ONDANSETRON HCL 4 MG/2ML IJ SOLN
4.0000 mg | Freq: Once | INTRAMUSCULAR | Status: AC
Start: 2011-05-03 — End: 2011-05-03
  Administered 2011-05-03: 4 mg via INTRAVENOUS
  Filled 2011-05-03: qty 2

## 2011-05-03 MED ORDER — ONDANSETRON HCL 4 MG/2ML IJ SOLN
4.0000 mg | Freq: Once | INTRAMUSCULAR | Status: AC
  Administered 2011-05-03: 4 mg via INTRAVENOUS
  Filled 2011-05-03: qty 2

## 2011-05-03 MED ORDER — PANTOPRAZOLE SODIUM 40 MG IV SOLR
40.0000 mg | Freq: Once | INTRAVENOUS | Status: AC
  Administered 2011-05-03: 40 mg via INTRAVENOUS
  Filled 2011-05-03: qty 40

## 2011-05-03 NOTE — ED Provider Notes (Signed)
History  This chart was scribed for Alan Lennert, MD by [name]. This patient was seen in room [facility]09/[facility]09 and the patient's care was started at 5:46PM.  CSN: [id number]  Arrival date & time 05/03/11  1537   First MD Initiated Contact with Patient 05/03/11 1742      Chief Complaint  Patient presents with  . Abdominal Pain    Patient is a 51 y.o. male presenting with abdominal pain. The history is provided by the patient. No language interpreter was used.  Abdominal Pain The primary symptoms of the illness include abdominal pain. The primary symptoms of the illness do not include fatigue, vomiting or diarrhea. The current episode started 2 days ago. The onset of the illness was sudden. The problem has been gradually worsening.  The abdominal pain began 2 days ago. The pain came on suddenly. The abdominal pain has been gradually worsening since its onset. The abdominal pain is located in the epigastric region. The abdominal pain radiates to the chest. The abdominal pain is relieved by nothing.  Additional symptoms associated with the illness include chills. Symptoms associated with the illness do not include hematuria, frequency or back pain.    [name] is a 51 y.o. male who presents to the Emergency Department complaining of 2 days of sudden onset, gradually worsening, constant abdominal pain located in the epigastric region that started after he took Clindamycin. The pain is described as "someone with a chainsaw in my stomach and chest". It radiates into his lower chest. He states that the pain waxes and wanes in severity. He states that he had teeth pulled on 04/30/11 and was prescribed Clindamycin. He states that he felt the pain about 30 minutes after taking the antibiotic. He reports that he called his dentist and was advised to drink water and eat. He states that he did that with no improvement. He reports taking 2 ibuprofen without improvement in his symptoms. He reports  that he takes 3 hydrocodone daily for his DJD but has not taken any since the onset of the abdominal pain. He denies any modifying factors. He denies having any prior episodes of similar symptoms to medication. He denies nausea, emesis, diarrhea, and urinary symptoms as associated symptoms.  He also has a h/o anxiety. He denies smoking and alcohol use.   Past Medical History  Diagnosis Date  . Anxiety   . Degenerative disc disease   . Degenerative joint disease     Past Surgical History  Procedure Date  . Appendectomy   . Ankle surgery   . Finger surgery     No family history on file.  History  Substance Use Topics  . Smoking status: Not on file  . Smokeless tobacco: Current User  . Alcohol Use: No      Review of Systems  Constitutional: Positive for chills. Negative for fatigue.  HENT: Negative for congestion, sinus pressure and ear discharge.   Eyes: Negative for discharge.  Respiratory: Negative for cough.   Cardiovascular: Negative for chest pain.  Gastrointestinal: Positive for abdominal pain. Negative for vomiting and diarrhea.  Genitourinary: Negative for frequency and hematuria.  Musculoskeletal: Negative for back pain.  Skin: Negative for rash.  Neurological: Negative for seizures and headaches.  Hematological: Negative.   Psychiatric/Behavioral: Negative for hallucinations.    Allergies  Penicillins  Home Medications   Current Outpatient Rx  Name Route Sig Dispense Refill  . ALPRAZOLAM 2 MG PO TABS Oral Take 2 mg by mouth 4 (  four) times daily.      Marland Kitchen HYDROCODONE-ACETAMINOPHEN 10-325 MG PO TABS Oral Take 1-2 tablets by mouth every 6 (six) hours as needed. For pain     . HYDROGEN PEROXIDE 3 % EX SOLN Oral Take 1 application by mouth 3 (three) times daily as needed. For stiches in mouth. Swish and Spit    . PREDNISONE 10 MG PO TABS Oral Take 10 mg by mouth daily as needed. For bone disease    . TRAZODONE HCL 50 MG PO TABS Oral Take 150 mg by mouth at  bedtime. For sleep       Triage Vitals: BP 113/81  Pulse 68  Temp(Src) 97.8 F (36.6 C) (Oral)  Resp 20  Ht 6' (1.829 m)  Wt 184 lb (83.462 kg)  BMI 24.95 kg/m2  SpO2 100%  Physical Exam  Nursing note and vitals reviewed. Constitutional: He is oriented to person, place, and time. He appears well-developed and well-nourished.  HENT:  Head: Normocephalic and atraumatic.       Poor dentition  Eyes: Conjunctivae and EOM are normal.  Neck: Normal range of motion. Neck supple.  Cardiovascular: Normal rate, regular rhythm and normal heart sounds.   Pulmonary/Chest: Effort normal and breath sounds normal. No respiratory distress.  Abdominal: Soft. There is tenderness.       Tender in the epigastric region  Musculoskeletal: Normal range of motion. He exhibits no edema.  Neurological: He is alert and oriented to person, place, and time. No cranial nerve deficit.  Skin: Skin is warm and dry.  Psychiatric: He has a normal mood and affect. His behavior is normal.    ED Course  Procedures (including critical care time)  DIAGNOSTIC STUDIES: Oxygen Saturation is 100% on room air, normal by my interpretation.    COORDINATION OF CARE: 5:47PM- Discussed blood work, x-rays and medications with pt and pt agreed to plan.  Labs Reviewed  COMPREHENSIVE METABOLIC PANEL - Abnormal; Notable for the following:    GFR calc non Af Amer 70 (*)    GFR calc Af Amer 81 (*)    All other components within normal limits  CBC  DIFFERENTIAL  LIPASE, BLOOD   Dg Abd Acute W/chest  05/03/2011  *RADIOLOGY REPORT*  Clinical Data: Abdominal pain, nausea and vomiting  ACUTE ABDOMEN SERIES (ABDOMEN 2 VIEW & CHEST 1 VIEW)  Comparison:  08/06/2008  Findings:  There is no evidence of dilated bowel loops or free intraperitoneal air.  No radiopaque calculi or other significant radiographic abnormality is seen. Heart size and mediastinal contours are within normal limits.  Both lungs are clear. Possible minimal L1  level compression deformity is stable.  IMPRESSION: Negative abdominal radiographs.  No acute cardiopulmonary disease.  Original Report Authenticated By: Harrel Lemon, M.D.     No diagnosis found.    MDM  gatritis  Pt improved with tx         ./j The chart was scribed for me under my direct supervision.  I personally performed the history, physical, and medical decision making and all procedures in the evaluation of this patient.Alan Lennert, MD 05/03/11 2013

## 2011-05-03 NOTE — Discharge Instructions (Signed)
Follow up with your md this week. °

## 2011-05-03 NOTE — ED Notes (Signed)
Discharge instructions given and reviewed with patient.  Prescription given for Zantac; effects and use explained.  Patient verbalized understanding to f/u with PMD and dentist this week.  Patient ambulatory; discharged home in good condition.  Friend to drive home.

## 2011-05-03 NOTE — ED Notes (Signed)
Had toothache went to dentist Thursday started on meds, now having describes at "someone w/ chain saw in my stomach and chest".

## 2012-03-16 ENCOUNTER — Encounter (HOSPITAL_COMMUNITY): Payer: Self-pay | Admitting: Emergency Medicine

## 2012-03-16 ENCOUNTER — Emergency Department (HOSPITAL_COMMUNITY): Payer: Medicaid Other

## 2012-03-16 ENCOUNTER — Emergency Department (HOSPITAL_COMMUNITY)
Admission: EM | Admit: 2012-03-16 | Discharge: 2012-03-16 | Disposition: A | Discharge status: Home / Self Care | Payer: Medicaid Other | Attending: Emergency Medicine | Admitting: Emergency Medicine

## 2012-03-16 DIAGNOSIS — R1032 Left lower quadrant pain: Secondary | ICD-10-CM | POA: Insufficient documentation

## 2012-03-16 DIAGNOSIS — Z79899 Other long term (current) drug therapy: Secondary | ICD-10-CM | POA: Insufficient documentation

## 2012-03-16 DIAGNOSIS — F411 Generalized anxiety disorder: Secondary | ICD-10-CM | POA: Insufficient documentation

## 2012-03-16 DIAGNOSIS — Z8739 Personal history of other diseases of the musculoskeletal system and connective tissue: Secondary | ICD-10-CM | POA: Insufficient documentation

## 2012-03-16 LAB — CBC WITH DIFFERENTIAL/PLATELET
Basophils Absolute: 0 10*3/uL (ref 0.0–0.1)
Basophils Relative: 1 % (ref 0–1)
Eosinophils Absolute: 0.1 10*3/uL (ref 0.0–0.7)
Eosinophils Relative: 2 % (ref 0–5)
Lymphs Abs: 1.5 10*3/uL (ref 0.7–4.0)
MCH: 30.3 pg (ref 26.0–34.0)
MCV: 88.7 fL (ref 78.0–100.0)
Neutrophils Relative %: 61 % (ref 43–77)
Platelets: 161 10*3/uL (ref 150–400)
RBC: 4.78 MIL/uL (ref 4.22–5.81)
RDW: 13.6 % (ref 11.5–15.5)

## 2012-03-16 LAB — URINALYSIS, ROUTINE W REFLEX MICROSCOPIC
Bilirubin Urine: NEGATIVE
Nitrite: NEGATIVE
Protein, ur: NEGATIVE mg/dL
Specific Gravity, Urine: <1.005 — ABNORMAL LOW (ref 1.005–1.030)
Urobilinogen, UA: 0.2 mg/dL (ref 0.0–1.0)

## 2012-03-16 LAB — COMPREHENSIVE METABOLIC PANEL
ALT: 7 U/L (ref 0–53)
AST: 14 U/L (ref 0–37)
Albumin: 4 g/dL (ref 3.5–5.2)
Alkaline Phosphatase: 74 U/L (ref 39–117)
Calcium: 9.3 mg/dL (ref 8.4–10.5)
GFR calc Af Amer: 90 mL/min — ABNORMAL LOW (ref >90)
Potassium: 3.1 mEq/L — ABNORMAL LOW (ref 3.5–5.1)
Sodium: 138 mEq/L (ref 135–145)
Total Protein: 6.7 g/dL (ref 6.0–8.3)

## 2012-03-16 MED ORDER — POTASSIUM CHLORIDE CRYS ER 20 MEQ PO TBCR
40.0000 meq | EXTENDED_RELEASE_TABLET | Freq: Once | ORAL | Status: AC
  Administered 2012-03-16: 40 meq via ORAL
  Filled 2012-03-16: qty 2

## 2012-03-16 MED ORDER — DICYCLOMINE HCL 20 MG PO TABS: ORAL_TABLET | ORAL | Status: DC

## 2012-03-16 MED ORDER — DICYCLOMINE HCL 10 MG PO CAPS
ORAL_CAPSULE | ORAL | Status: AC
  Administered 2012-03-16: 10 mg via ORAL
  Filled 2012-03-16: qty 1

## 2012-03-16 MED ORDER — DICYCLOMINE HCL 10 MG PO CAPS
10.0000 mg | ORAL_CAPSULE | Freq: Once | ORAL | Status: AC
  Administered 2012-03-16: 10 mg via ORAL

## 2012-03-16 NOTE — ED Provider Notes (Signed)
History     CSN: [id number]  Arrival date & time 03/16/12  1939   First MD Initiated Contact with Patient 03/16/12 2009      Chief Complaint  Patient presents with  . Abdominal Pain  . Flank Pain    (Consider location/radiation/quality/duration/timing/severity/associated sxs/prior treatment) Patient is a 52 y.o. male presenting with abdominal pain and flank pain. The history is provided by the patient (the pt complains of left side abd cramps and pain).  Abdominal Pain Pain location: left lower quad. Pain quality: aching and dull   Pain radiates to:  Does not radiate Pain severity:  Moderate Onset quality:  Gradual Timing:  Constant Progression:  Waxing and waning Chronicity:  Chronic Context: not awakening from sleep   Associated symptoms: no chest pain, no cough, no diarrhea, no fatigue and no hematuria   Flank Pain Associated symptoms include abdominal pain. Pertinent negatives include no chest pain and no headaches.    Past Medical History  Diagnosis Date  . Anxiety   . Degenerative disc disease   . Degenerative joint disease     Past Surgical History  Procedure Laterality Date  . Appendectomy    . Ankle surgery    . Finger surgery      History reviewed. No pertinent family history.  History  Substance Use Topics  . Smoking status: Not on file  . Smokeless tobacco: Current User  . Alcohol Use: No      Review of Systems  Constitutional: Negative for fatigue.  HENT: Negative for congestion, sinus pressure and ear discharge.   Eyes: Negative for discharge.  Respiratory: Negative for cough.   Cardiovascular: Negative for chest pain.  Gastrointestinal: Positive for abdominal pain. Negative for diarrhea.  Genitourinary: Positive for flank pain. Negative for frequency and hematuria.  Musculoskeletal: Negative for back pain.  Skin: Negative for rash.  Neurological: Negative for seizures and headaches.  Psychiatric/Behavioral: Negative for hallucinations.     Allergies  Penicillins  Home Medications   Current Outpatient Rx  Name  Route  Sig  Dispense  Refill  . alprazolam (XANAX) 2 MG tablet   Oral   Take 2 mg by mouth 4 (four) times daily.           Marland Kitchen HYDROcodone-acetaminophen (NORCO) 10-325 MG per tablet   Oral   Take 1-2 tablets by mouth every 6 (six) hours as needed. For pain          . dicyclomine (BENTYL) 20 MG tablet      Take one pill every 6-8 hours for abdominal cramps   20 tablet   0     BP 131/90  Pulse 73  Temp(Src) 97.2 F (36.2 C) (Oral)  Resp 18  Ht 6' (1.829 m)  Wt 167 lb 9 oz (76.006 kg)  BMI 22.72 kg/m2  SpO2 100%  Physical Exam  Constitutional: He is oriented to person, place, and time. He appears well-developed.  HENT:  Head: Normocephalic and atraumatic.  Eyes: Conjunctivae and EOM are normal. No scleral icterus.  Neck: Neck supple. No thyromegaly present.  Cardiovascular: Normal rate and regular rhythm.  Exam reveals no gallop and no friction rub.   No murmur heard. Pulmonary/Chest: No stridor. He has no wheezes. He has no rales. He exhibits no tenderness.  Abdominal: He exhibits no distension. There is tenderness. There is no rebound.  Tender llq  Musculoskeletal: Normal range of motion. He exhibits no edema.  Lymphadenopathy:    He has no cervical adenopathy.  Neurological:  He is oriented to person, place, and time. Coordination normal.  Skin: No rash noted. No erythema.  Psychiatric: He has a normal mood and affect. His behavior is normal.    ED Course  Procedures (including critical care time)  Labs Reviewed  URINALYSIS, ROUTINE W REFLEX MICROSCOPIC - Abnormal; Notable for the following:    Specific Gravity, Urine <1.005 (*)    All other components within normal limits  COMPREHENSIVE METABOLIC PANEL - Abnormal; Notable for the following:    Potassium 3.1 (*)    Glucose, Bld 106 (*)    GFR calc non Af Amer 77 (*)    GFR calc Af Amer 90 (*)    All other components within  normal limits  CBC WITH DIFFERENTIAL   Ct Abdomen Pelvis Wo Contrast  03/16/2012  *RADIOLOGY REPORT*  Clinical Data: Left sided abdominal pain.  CT ABDOMEN AND PELVIS WITHOUT CONTRAST  Technique:  Multidetector CT imaging of the abdomen and pelvis was performed following the standard protocol without intravenous contrast.  Comparison: 07/07/2004  Findings: Visualized lung bases clear.  Stable cysts in hepatic segments 4a and 7.  Punctate granuloma in segment 3. Unremarkable uninfused evaluation of the spleen, adrenal glands, kidneys, pancreas, abdominal aorta.  No nephrolithiasis or hydronephrosis. Circumaortic left renal vein, an anatomic variant.  Stomach, small bowel, and colon are nondilated.  The appendix is not discretely identified.  Multiple pelvic phleboliths.  Urinary bladder incompletely distended.  No ascites.  No free air.  No adenopathy localized. Degenerative disc disease L5-S1.  IMPRESSION:  1.  No acute abdominal process.   Original Report Authenticated By: D. Andria Rhein, MD      1. Abdominal pain       MDM          [name], MD 03/16/12 2132

## 2012-03-16 NOTE — ED Notes (Signed)
Left sided abd pain, states stomach "knots up", unable to eat due to nausea after eating, denies burning on urination, normal BM's and last today

## 2012-03-16 NOTE — ED Notes (Signed)
Pt c/o left abd pain/flank pain x 4-5 days. Pt states after he eats he becomes nauseated.

## 2012-03-17 ENCOUNTER — Emergency Department (HOSPITAL_COMMUNITY)
Admission: EM | Admit: 2012-03-17 | Discharge: 2012-03-17 | Disposition: A | Discharge status: Home / Self Care | Payer: Medicaid Other | Attending: Emergency Medicine | Admitting: Emergency Medicine

## 2012-03-17 ENCOUNTER — Encounter (HOSPITAL_COMMUNITY): Payer: Self-pay | Admitting: Emergency Medicine

## 2012-03-17 DIAGNOSIS — R1012 Left upper quadrant pain: Secondary | ICD-10-CM | POA: Insufficient documentation

## 2012-03-17 DIAGNOSIS — F411 Generalized anxiety disorder: Secondary | ICD-10-CM | POA: Insufficient documentation

## 2012-03-17 DIAGNOSIS — Z79899 Other long term (current) drug therapy: Secondary | ICD-10-CM | POA: Insufficient documentation

## 2012-03-17 DIAGNOSIS — Z8739 Personal history of other diseases of the musculoskeletal system and connective tissue: Secondary | ICD-10-CM | POA: Insufficient documentation

## 2012-03-17 MED ORDER — DICYCLOMINE HCL 10 MG PO CAPS
10.0000 mg | ORAL_CAPSULE | Freq: Once | ORAL | Status: AC
  Administered 2012-03-17: 10 mg via ORAL
  Filled 2012-03-17: qty 1

## 2012-03-17 NOTE — ED Provider Notes (Signed)
History     CSN: [id number]  Arrival date & time 03/17/12  0453   First MD Initiated Contact with Patient 03/17/12 0503      Chief Complaint  Patient presents with  . Abdominal Pain    (Consider location/radiation/quality/duration/timing/severity/associated sxs/prior treatment) HPI [name] is a 52 y.o. male brought in by ambulance, who presents to the Emergency Department complaining of abdominal pain, primarily in the left upper quadrant.  He was seen here earlier in the evening, had a CT done which was negative, sent home with a prescription for bentyl. He went home and had a Dr. Reino Kent and bag of potato chips. He began having LUQ pain again. Denies fever, chills, nausea, vomiting, diarrhea.  PCP Dr. Sudie Bailey Past Medical History  Diagnosis Date  . Anxiety   . Degenerative disc disease   . Degenerative joint disease     Past Surgical History  Procedure Laterality Date  . Appendectomy    . Ankle surgery    . Finger surgery      No family history on file.  History  Substance Use Topics  . Smoking status: Not on file  . Smokeless tobacco: Current User  . Alcohol Use: No      Review of Systems  Constitutional: Negative for fever.       10 Systems reviewed and are negative for acute change except as noted in the HPI.  HENT: Negative for congestion.   Eyes: Negative for discharge and redness.  Respiratory: Negative for cough and shortness of breath.   Cardiovascular: Negative for chest pain.  Gastrointestinal: Negative for vomiting and abdominal pain.       LUQ pain  Musculoskeletal: Negative for back pain.  Skin: Negative for rash.  Neurological: Negative for syncope, numbness and headaches.  Psychiatric/Behavioral:       No behavior change.    Allergies  Penicillins  Home Medications   Current Outpatient Rx  Name  Route  Sig  Dispense  Refill  . alprazolam (XANAX) 2 MG tablet   Oral   Take 2 mg by mouth 4 (four) times daily.           Marland Kitchen  dicyclomine (BENTYL) 20 MG tablet      Take one pill every 6-8 hours for abdominal cramps   20 tablet   0   . HYDROcodone-acetaminophen (NORCO) 10-325 MG per tablet   Oral   Take 1-2 tablets by mouth every 6 (six) hours as needed. For pain            BP 118/93  Pulse 75  Temp(Src) 97.5 F (36.4 C) (Oral)  Resp 18  Ht 6' (1.829 m)  Wt 167 lb (75.751 kg)  BMI 22.64 kg/m2  SpO2 97%  Physical Exam  Nursing note and vitals reviewed. Constitutional: He appears well-developed and well-nourished.  Awake, alert, nontoxic appearance.  HENT:  Head: Normocephalic and atraumatic.  Right Ear: External ear normal.  Left Ear: External ear normal.  Mouth/Throat: Oropharynx is clear and moist.  Eyes: EOM are normal. Pupils are equal, round, and reactive to light. Right eye exhibits no discharge. Left eye exhibits no discharge.  Neck: Normal range of motion. Neck supple.  Cardiovascular: Normal rate.   Pulmonary/Chest: Effort normal and breath sounds normal. He exhibits no tenderness.  Abdominal: Soft. There is no tenderness. There is no rebound.  LUQ tenderness with palpation.  Musculoskeletal: He exhibits no tenderness.  Baseline ROM, no obvious new focal weakness.  Neurological:  Mental  status and motor strength appears baseline for patient and situation.  Skin: No rash noted.  Psychiatric: He has a normal mood and affect.    ED Course  Procedures (including critical care time)  Labs Reviewed - No data to display Ct Abdomen Pelvis Wo Contrast  03/16/2012  *RADIOLOGY REPORT*  Clinical Data: Left sided abdominal pain.  CT ABDOMEN AND PELVIS WITHOUT CONTRAST  Technique:  Multidetector CT imaging of the abdomen and pelvis was performed following the standard protocol without intravenous contrast.  Comparison: 07/07/2004  Findings: Visualized lung bases clear.  Stable cysts in hepatic segments 4a and 7.  Punctate granuloma in segment 3. Unremarkable uninfused evaluation of the spleen,  adrenal glands, kidneys, pancreas, abdominal aorta.  No nephrolithiasis or hydronephrosis. Circumaortic left renal vein, an anatomic variant.  Stomach, small bowel, and colon are nondilated.  The appendix is not discretely identified.  Multiple pelvic phleboliths.  Urinary bladder incompletely distended.  No ascites.  No free air.  No adenopathy localized. Degenerative disc disease L5-S1.  IMPRESSION:  1.  No acute abdominal process.   Original Report Authenticated By: D. Andria Rhein, MD         MDM  Patient with LUQ tenderness, negative CT. Given Bentyl and referral to GI. Pt stable in ED with no significant deterioration in condition.The patient appears reasonably screened and/or stabilized for discharge and I doubt any other medical condition or other [facility] requiring further screening, evaluation, or treatment in the ED at this time prior to discharge.  MDM Reviewed: nursing note and vitals Reviewed previous: x-ray           Nicoletta Dress. Colon Branch, MD 03/17/12 1610

## 2012-03-17 NOTE — ED Notes (Signed)
Patient stated that he ate chips and drank a soda when he was discharged and the pain on is left flank is worse. States he took a xanax prior to arrival to er.

## 2012-03-25 ENCOUNTER — Other Ambulatory Visit (HOSPITAL_COMMUNITY): Payer: Self-pay | Admitting: Family Medicine

## 2012-03-28 ENCOUNTER — Ambulatory Visit (HOSPITAL_COMMUNITY)
Admission: RE | Admit: 2012-03-28 | Discharge: 2012-03-28 | Disposition: A | Discharge status: Home / Self Care | Payer: Medicaid Other | Source: Ambulatory Visit | Attending: Family Medicine | Admitting: Family Medicine

## 2012-03-28 DIAGNOSIS — R634 Abnormal weight loss: Secondary | ICD-10-CM | POA: Insufficient documentation

## 2012-03-28 DIAGNOSIS — R109 Unspecified abdominal pain: Secondary | ICD-10-CM | POA: Insufficient documentation

## 2012-04-07 ENCOUNTER — Encounter (INDEPENDENT_AMBULATORY_CARE_PROVIDER_SITE_OTHER): Payer: Self-pay | Admitting: *Deleted

## 2012-04-11 ENCOUNTER — Telehealth (INDEPENDENT_AMBULATORY_CARE_PROVIDER_SITE_OTHER): Payer: Self-pay | Admitting: *Deleted

## 2012-04-11 ENCOUNTER — Ambulatory Visit (INDEPENDENT_AMBULATORY_CARE_PROVIDER_SITE_OTHER): Payer: Medicaid Other | Admitting: Internal Medicine

## 2012-04-11 ENCOUNTER — Other Ambulatory Visit (INDEPENDENT_AMBULATORY_CARE_PROVIDER_SITE_OTHER): Payer: Self-pay | Admitting: *Deleted

## 2012-04-11 ENCOUNTER — Encounter (INDEPENDENT_AMBULATORY_CARE_PROVIDER_SITE_OTHER): Payer: Self-pay | Admitting: Internal Medicine

## 2012-04-11 ENCOUNTER — Encounter (HOSPITAL_COMMUNITY): Payer: Self-pay | Admitting: Pharmacy Technician

## 2012-04-11 VITALS — BP 100/60 | HR 68 | Ht 72.0 in | Wt 165.4 lb

## 2012-04-11 DIAGNOSIS — R1013 Epigastric pain: Secondary | ICD-10-CM

## 2012-04-11 DIAGNOSIS — Z1211 Encounter for screening for malignant neoplasm of colon: Secondary | ICD-10-CM

## 2012-04-11 DIAGNOSIS — M1990 Unspecified osteoarthritis, unspecified site: Secondary | ICD-10-CM

## 2012-04-11 DIAGNOSIS — G8929 Other chronic pain: Secondary | ICD-10-CM

## 2012-04-11 DIAGNOSIS — F411 Generalized anxiety disorder: Secondary | ICD-10-CM | POA: Insufficient documentation

## 2012-04-11 MED ORDER — PEG-KCL-NACL-NASULF-NA ASC-C 100 G PO SOLR: 1.0000 | Freq: Once | ORAL | Status: DC
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
## 2012-04-11 NOTE — Patient Instructions (Addendum)
EGD/Colonoscfopy

## 2012-04-11 NOTE — Telephone Encounter (Signed)
Patient needs movi prep 

## 2012-04-18 NOTE — Patient Instructions (Addendum)
JESSEY STEHLIN  04/18/2012   Your procedure is scheduled on:  04/25/12  [facility] to [facility] [place] [facility].  Call this number if you have problems the morning of surgery: [phone]   Remember:   Do not eat food or drink liquids after midnight.   Take these medicines the morning of surgery with A SIP OF WATER: xanax, norco, protonix   Do not wear jewelry, make-up or nail polish.  Do not wear lotions, powders, or perfumes. You may wear deodorant.  Do not shave 48 hours prior to surgery. Men may shave face and neck.  Do not bring valuables to the hospital.  Contacts, dentures or bridgework may not be worn into surgery.  Leave suitcase in the car. After surgery it may be brought to your room.  For patients admitted to the hospital, checkout time is 11:00 AM the day of  discharge.   Patients discharged the day of surgery will not be allowed to drive  home.  Name and phone number of your driver: family  Special Instructions: N/A   Please read over the following fact sheets that you were given: Anesthesia Post-op Instructions and Care and Recovery After Surgery   PATIENT INSTRUCTIONS POST-ANESTHESIA  IMMEDIATELY FOLLOWING SURGERY:  Do not drive or operate machinery for the first twenty four hours after surgery.  Do not make any important decisions for twenty four hours after surgery or while taking narcotic pain medications or sedatives.  If you develop intractable nausea and vomiting or a severe headache please notify your doctor immediately.  FOLLOW-UP:  Please make an appointment with your surgeon as instructed. You do not need to follow up with anesthesia unless specifically instructed to do so.  WOUND CARE INSTRUCTIONS (if applicable):  Keep a dry clean dressing on the anesthesia/puncture wound site if there is drainage.  Once the wound has quit draining you may leave it open to air.  Generally you should leave the bandage intact for twenty four hours unless there is drainage.  If the  epidural site drains for more than 36-48 hours please call the anesthesia department.  QUESTIONS?:  Please feel free to call your physician or the hospital operator if you have any questions, and they will be happy to assist you.      Esophagogastroduodenoscopy This is an endoscopic procedure (a procedure that uses a device like a flexible telescope) that allows your caregiver to view the upper stomach and small bowel. This test allows your caregiver to look at the esophagus. The esophagus carries food from your mouth to your stomach. They can also look at your duodenum. This is the first part of the small intestine that attaches to the stomach. This test is used to detect problems in the bowel such as ulcers and inflammation. PREPARATION FOR TEST Nothing to eat after midnight the day before the test. NORMAL FINDINGS Normal esophagus, stomach, and duodenum. Ranges for normal findings may vary among different laboratories and hospitals. You should always check with your doctor after having lab work or other tests done to discuss the meaning of your test results and whether your values are considered within normal limits. MEANING OF TEST  Your caregiver will go over the test results with you and discuss the importance and meaning of your results, as well as treatment options and the need for additional tests if necessary. OBTAINING THE TEST RESULTS It is your responsibility to obtain your test results. Ask the lab or department performing the test when and  how you will get your results. Document Released: 05/01/2004 Document Revised: 03/23/2011 Document Reviewed: 12/09/2007 [facility] Patient Information 2013 [place], [place]. Colonoscopy A colonoscopy is an exam to evaluate your entire colon. In this exam, your colon is cleansed. A long fiberoptic tube is inserted through your rectum and into your colon. The fiberoptic scope (endoscope) is a long bundle of enclosed and very flexible fibers. These  fibers transmit light to the area examined and send images from that area to your caregiver. Discomfort is usually minimal. You may be given a drug to help you sleep (sedative) during or prior to the procedure. This exam helps to detect lumps (tumors), polyps, inflammation, and areas of bleeding. Your caregiver may also take a small piece of tissue (biopsy) that will be examined under a microscope. LET YOUR CAREGIVER KNOW ABOUT:   Allergies to food or medicine.  Medicines taken, including vitamins, herbs, eyedrops, over-the-counter medicines, and creams.  Use of steroids (by mouth or creams).  Previous problems with anesthetics or numbing medicines.  History of bleeding problems or blood clots.  Previous surgery.  Other health problems, including diabetes and kidney problems.  Possibility of pregnancy, if this applies. BEFORE THE PROCEDURE   A clear liquid diet may be required for 2 days before the exam.  Ask your caregiver about changing or stopping your regular medications.  Liquid injections (enemas) or laxatives may be required.  A large amount of electrolyte solution may be given to you to drink over a short period of time. This solution is used to clean out your colon.  You should be present 60 minutes prior to your procedure or as directed by your caregiver. AFTER THE PROCEDURE   If you received a sedative or pain relieving medication, you will need to arrange for someone to drive you home.  Occasionally, there is a little blood passed with the first bowel movement. Do not be concerned. FINDING OUT THE RESULTS OF YOUR TEST Not all test results are available during your visit. If your test results are not back during the visit, make an appointment with your caregiver to find out the results. Do not assume everything is normal if you have not heard from your caregiver or the medical facility. It is important for you to follow up on all of your test results. HOME CARE  INSTRUCTIONS   It is not unusual to pass moderate amounts of gas and experience mild abdominal cramping following the procedure. This is due to air being used to inflate your colon during the exam. Walking or a warm pack on your belly (abdomen) may help.  You may resume all normal meals and activities after sedatives and medicines have worn off.  Only take over-the-counter or prescription medicines for pain, discomfort, or fever as directed by your caregiver. Do not use aspirin or blood thinners if a biopsy was taken. Consult your caregiver for medicine usage if biopsies were taken. SEEK IMMEDIATE MEDICAL CARE IF:   You have a fever.  You pass large blood clots or fill a toilet with blood following the procedure. This may also occur 10 to 14 days following the procedure. This is more likely if a biopsy was taken.  You develop abdominal pain that keeps getting worse and cannot be relieved with medicine. Document Released: 12/27/1999 Document Revised: 03/23/2011 Document Reviewed: 08/11/2007 [facility] Patient Information 2013 [place], [place].

## 2012-04-19 ENCOUNTER — Encounter (HOSPITAL_COMMUNITY)
Admission: RE | Admit: 2012-04-19 | Discharge: 2012-04-19 | Disposition: A | Discharge status: Home / Self Care | Payer: Medicaid Other | Source: Ambulatory Visit | Attending: Internal Medicine | Admitting: Internal Medicine

## 2012-04-19 ENCOUNTER — Encounter (HOSPITAL_COMMUNITY): Payer: Self-pay

## 2012-04-19 VITALS — BP 128/84 | HR 70 | Temp 98.0°F | Resp 16

## 2012-04-19 DIAGNOSIS — G8929 Other chronic pain: Secondary | ICD-10-CM

## 2012-04-19 DIAGNOSIS — R1013 Epigastric pain: Secondary | ICD-10-CM

## 2012-04-19 DIAGNOSIS — Z1211 Encounter for screening for malignant neoplasm of colon: Secondary | ICD-10-CM

## 2012-04-19 LAB — BASIC METABOLIC PANEL
BUN: 7 mg/dL (ref 6–23)
CO2: 30 mEq/L (ref 19–32)
Calcium: 9.4 mg/dL (ref 8.4–10.5)
Chloride: 102 mEq/L (ref 96–112)
Creatinine, Ser: 1.17 mg/dL (ref 0.50–1.35)

## 2012-04-19 LAB — CBC
HCT: 43.8 % (ref 39.0–52.0)
MCH: 29.9 pg (ref 26.0–34.0)
MCV: 89.2 fL (ref 78.0–100.0)
Platelets: 151 10*3/uL (ref 150–400)
RDW: 13.5 % (ref 11.5–15.5)
WBC: 4.4 10*3/uL (ref 4.0–10.5)

## 2012-04-21 ENCOUNTER — Encounter (INDEPENDENT_AMBULATORY_CARE_PROVIDER_SITE_OTHER): Payer: Self-pay

## 2012-04-25 ENCOUNTER — Encounter (HOSPITAL_COMMUNITY)
Admission: RE | Disposition: A | Discharge status: Home / Self Care | Payer: Self-pay | Source: Ambulatory Visit | Attending: Internal Medicine

## 2012-04-25 ENCOUNTER — Ambulatory Visit (HOSPITAL_COMMUNITY): Payer: Medicaid Other | Admitting: Anesthesiology

## 2012-04-25 ENCOUNTER — Ambulatory Visit: Admit: 2012-04-25 | Payer: Self-pay | Admitting: Internal Medicine

## 2012-04-25 ENCOUNTER — Encounter (HOSPITAL_COMMUNITY): Payer: Self-pay | Admitting: Anesthesiology

## 2012-04-25 ENCOUNTER — Encounter (HOSPITAL_COMMUNITY): Payer: Self-pay

## 2012-04-25 ENCOUNTER — Ambulatory Visit (HOSPITAL_COMMUNITY)
Admission: RE | Admit: 2012-04-25 | Discharge: 2012-04-25 | Disposition: A | Discharge status: Home / Self Care | Payer: Medicaid Other | Source: Ambulatory Visit | Attending: Internal Medicine | Admitting: Internal Medicine

## 2012-04-25 DIAGNOSIS — R110 Nausea: Secondary | ICD-10-CM

## 2012-04-25 DIAGNOSIS — K644 Residual hemorrhoidal skin tags: Secondary | ICD-10-CM

## 2012-04-25 DIAGNOSIS — R634 Abnormal weight loss: Secondary | ICD-10-CM | POA: Insufficient documentation

## 2012-04-25 DIAGNOSIS — K6389 Other specified diseases of intestine: Secondary | ICD-10-CM

## 2012-04-25 DIAGNOSIS — M1990 Unspecified osteoarthritis, unspecified site: Secondary | ICD-10-CM | POA: Insufficient documentation

## 2012-04-25 DIAGNOSIS — F411 Generalized anxiety disorder: Secondary | ICD-10-CM | POA: Insufficient documentation

## 2012-04-25 DIAGNOSIS — Z1211 Encounter for screening for malignant neoplasm of colon: Secondary | ICD-10-CM

## 2012-04-25 DIAGNOSIS — Z79899 Other long term (current) drug therapy: Secondary | ICD-10-CM | POA: Insufficient documentation

## 2012-04-25 DIAGNOSIS — R1013 Epigastric pain: Secondary | ICD-10-CM | POA: Insufficient documentation

## 2012-04-25 DIAGNOSIS — Z87891 Personal history of nicotine dependence: Secondary | ICD-10-CM | POA: Insufficient documentation

## 2012-04-25 DIAGNOSIS — R630 Anorexia: Secondary | ICD-10-CM | POA: Insufficient documentation

## 2012-04-25 DIAGNOSIS — K219 Gastro-esophageal reflux disease without esophagitis: Secondary | ICD-10-CM | POA: Insufficient documentation

## 2012-04-25 DIAGNOSIS — K2960 Other gastritis without bleeding: Secondary | ICD-10-CM

## 2012-04-25 HISTORY — PX: ESOPHAGOGASTRODUODENOSCOPY (EGD) WITH PROPOFOL: SHX5813

## 2012-04-25 HISTORY — PX: COLONOSCOPY WITH PROPOFOL: SHX5780

## 2012-04-25 SURGERY — ESOPHAGOGASTRODUODENOSCOPY (EGD) WITH PROPOFOL
Anesthesia: Monitor Anesthesia Care

## 2012-04-25 SURGERY — COLONOSCOPY WITH ESOPHAGOGASTRODUODENOSCOPY (EGD)
Anesthesia: Moderate Sedation

## 2012-04-25 MED ORDER — FENTANYL CITRATE 0.05 MG/ML IJ SOLN: 25.0000 ug | INTRAMUSCULAR | Status: DC | PRN

## 2012-04-25 MED ORDER — LACTATED RINGERS IV SOLN
INTRAVENOUS | Status: DC | PRN
  Administered 2012-04-25: 08:00:00 via INTRAVENOUS

## 2012-04-25 MED ORDER — GLYCOPYRROLATE 0.2 MG/ML IJ SOLN
INTRAMUSCULAR | Status: AC
  Filled 2012-04-25: qty 1

## 2012-04-25 MED ORDER — MIDAZOLAM HCL 2 MG/2ML IJ SOLN
INTRAMUSCULAR | Status: AC
  Filled 2012-04-25: qty 2

## 2012-04-25 MED ORDER — MIDAZOLAM HCL 2 MG/2ML IJ SOLN
1.0000 mg | INTRAMUSCULAR | Status: DC | PRN
  Administered 2012-04-25: 2 mg via INTRAVENOUS

## 2012-04-25 MED ORDER — LACTATED RINGERS IV SOLN
INTRAVENOUS | Status: DC
  Administered 2012-04-25: 07:00:00 via INTRAVENOUS

## 2012-04-25 MED ORDER — ONDANSETRON HCL 4 MG/2ML IJ SOLN: 4.0000 mg | Freq: Once | INTRAMUSCULAR | Status: DC | PRN

## 2012-04-25 MED ORDER — BUTAMBEN-TETRACAINE-BENZOCAINE 2-2-14 % EX AERO
1.0000 | INHALATION_SPRAY | Freq: Once | CUTANEOUS | Status: AC
  Administered 2012-04-25: 1 via TOPICAL
  Filled 2012-04-25: qty 56

## 2012-04-25 MED ORDER — MIDAZOLAM HCL 5 MG/5ML IJ SOLN
INTRAMUSCULAR | Status: DC | PRN
  Administered 2012-04-25 (×2): 1 mg via INTRAVENOUS

## 2012-04-25 MED ORDER — FENTANYL CITRATE 0.05 MG/ML IJ SOLN
INTRAMUSCULAR | Status: DC | PRN
  Administered 2012-04-25: 25 ug via INTRAVENOUS
  Administered 2012-04-25: 50 ug via INTRAVENOUS
  Administered 2012-04-25: 25 ug via INTRAVENOUS

## 2012-04-25 MED ORDER — FENTANYL CITRATE 0.05 MG/ML IJ SOLN
INTRAMUSCULAR | Status: AC
  Filled 2012-04-25: qty 2

## 2012-04-25 MED ORDER — STERILE WATER FOR IRRIGATION IR SOLN
Status: DC | PRN
  Administered 2012-04-25: 08:00:00

## 2012-04-25 MED ORDER — PROPOFOL 10 MG/ML IV EMUL
INTRAVENOUS | Status: AC
  Filled 2012-04-25: qty 20

## 2012-04-25 MED ORDER — ONDANSETRON HCL 4 MG/2ML IJ SOLN
INTRAMUSCULAR | Status: AC
  Filled 2012-04-25: qty 2

## 2012-04-25 MED ORDER — DICYCLOMINE HCL 10 MG PO CAPS: 10.0000 mg | ORAL_CAPSULE | Freq: Two times a day (BID) | ORAL | Status: DC

## 2012-04-25 MED ORDER — LIDOCAINE HCL (PF) 1 % IJ SOLN
INTRAMUSCULAR | Status: AC
  Filled 2012-04-25: qty 5

## 2012-04-25 MED ORDER — ONDANSETRON HCL 4 MG/2ML IJ SOLN
4.0000 mg | Freq: Once | INTRAMUSCULAR | Status: AC
  Administered 2012-04-25: 4 mg via INTRAVENOUS

## 2012-04-25 MED ORDER — PROPOFOL INFUSION 10 MG/ML OPTIME
INTRAVENOUS | Status: DC | PRN
  Administered 2012-04-25: 120 ug/kg/min via INTRAVENOUS

## 2012-04-25 MED ORDER — GLYCOPYRROLATE 0.2 MG/ML IJ SOLN
0.2000 mg | Freq: Once | INTRAMUSCULAR | Status: AC
  Administered 2012-04-25: 0.2 mg via INTRAVENOUS

## 2012-04-25 SURGICAL SUPPLY — 24 items
BLOCK BITE 60FR ADLT L/F BLUE (MISCELLANEOUS) ×1 IMPLANT
ELECT REM PT RETURN 9FT ADLT (ELECTROSURGICAL)
ELECTRODE REM PT RTRN 9FT ADLT (ELECTROSURGICAL) IMPLANT
FCP BXJMBJMB 240X2.8X (CUTTING FORCEPS)
FLOOR PAD 36X40 (MISCELLANEOUS) ×2
FORCEP COLD BIOPSY (CUTTING FORCEPS) IMPLANT
FORCEPS BIOP RAD 4 LRG CAP 4 (CUTTING FORCEPS) IMPLANT
FORCEPS BIOP RJ4 240 W/NDL (CUTTING FORCEPS)
FORCEPS BXJMBJMB 240X2.8X (CUTTING FORCEPS) IMPLANT
INJECTOR/SNARE I SNARE (MISCELLANEOUS) IMPLANT
LUBRICANT JELLY 4.5OZ STERILE (MISCELLANEOUS) ×1 IMPLANT
MANIFOLD NEPTUNE II (INSTRUMENTS) ×2 IMPLANT
NDL SCLEROTHERAPY 25GX240 (NEEDLE) IMPLANT
NEEDLE SCLEROTHERAPY 25GX240 (NEEDLE) IMPLANT
PAD FLOOR 36X40 (MISCELLANEOUS) IMPLANT
PROBE APC STR FIRE (PROBE) IMPLANT
PROBE INJECTION GOLD (MISCELLANEOUS)
PROBE INJECTION GOLD 7FR (MISCELLANEOUS) IMPLANT
SNARE ROTATE MED OVAL 20MM (MISCELLANEOUS) IMPLANT
SYR 50ML LL SCALE MARK (SYRINGE) ×1 IMPLANT
TRAP SPECIMEN MUCOUS 40CC (MISCELLANEOUS) IMPLANT
TUBING ENDO SMARTCAP PENTAX (MISCELLANEOUS) IMPLANT
TUBING IRRIGATION ENDOGATOR (MISCELLANEOUS) ×1 IMPLANT
WATER STERILE IRR 1000ML POUR (IV SOLUTION) ×1 IMPLANT

## 2012-04-25 NOTE — Preoperative (Signed)
Beta Blockers   Reason not to administer Beta Blockers:Not Applicable 

## 2012-04-25 NOTE — Anesthesia Postprocedure Evaluation (Signed)
  Anesthesia Post-op Note  Patient: [name]  Procedure(s) Performed: Procedure(s) with comments: ESOPHAGOGASTRODUODENOSCOPY (EGD) WITH PROPOFOL (N/A) - GE junction at 41; procedure end at 0749 COLONOSCOPY WITH PROPOFOL (N/A) - in cecum at 0801; total withdrawal time = 6 minutes   Patient Location: PACU  Anesthesia Type:MAC  Level of Consciousness: awake, alert , oriented and patient cooperative  Airway and Oxygen Therapy: Patient Spontanous Breathing  Post-op Pain: mild  Post-op Assessment: Post-op Vital signs reviewed, Patient's Cardiovascular Status Stable, Respiratory Function Stable, Patent Airway, No signs of Nausea or vomiting, Adequate PO intake, Pain level controlled, No headache, No backache, No residual numbness and No residual motor weakness  Post-op Vital Signs: Reviewed and stable  Complications: No apparent anesthesia complications
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
## 2012-04-25 NOTE — Transfer of Care (Signed)
Immediate Anesthesia Transfer of Care Note  Patient: [name]  Procedure(s) Performed: Procedure(s) with comments: ESOPHAGOGASTRODUODENOSCOPY (EGD) WITH PROPOFOL (N/A) - GE junction at 41; procedure end at 0749 COLONOSCOPY WITH PROPOFOL (N/A) - in cecum at 0801; total withdrawal time = 6 minutes   Patient Location: PACU  Anesthesia Type:MAC  Level of Consciousness: awake, alert  and oriented  Airway & Oxygen Therapy: Patient Spontanous Breathing and Patient connected to nasal cannula oxygen  Post-op Assessment: Report given to PACU RN and Post -op Vital signs reviewed and stable  Post vital signs: Reviewed and stable  Complications: No apparent anesthesia complications

## 2012-04-25 NOTE — Anesthesia Preprocedure Evaluation (Addendum)
Anesthesia Evaluation  Patient identified by MRN, date of birth, ID band Patient awake    Reviewed: Allergy & Precautions, H&P , NPO status , Patient's Chart, lab work & pertinent test results  History of Anesthesia Complications Negative for: history of anesthetic complications  Airway Mallampati: I TM Distance: >3 FB Neck ROM: Full    Dental  (+) Poor Dentition, Missing, Loose, Chipped and Dental Advisory Given   Pulmonary former smoker,  breath sounds clear to auscultation        Cardiovascular negative cardio ROS  Rhythm:Regular Rate:Normal     Neuro/Psych PSYCHIATRIC DISORDERS Anxiety negative neurological ROS     GI/Hepatic Neg liver ROS, GERD-  Medicated and Controlled,  Endo/Other  negative endocrine ROS  Renal/GU      Musculoskeletal negative musculoskeletal ROS (+)   Abdominal   Peds  Hematology negative hematology ROS (+)   Anesthesia Other Findings   Reproductive/Obstetrics negative OB ROS                          Anesthesia Physical Anesthesia Plan  ASA: III  Anesthesia Plan: MAC   Post-op Pain Management:    Induction: Intravenous  Airway Management Planned: Simple Face Mask  Additional Equipment:   Intra-op Plan:   Post-operative Plan:   Informed Consent: I have reviewed the patients History and Physical, chart, labs and discussed the procedure including the risks, benefits and alternatives for the proposed anesthesia with the patient or authorized representative who has indicated his/her understanding and acceptance.     Plan Discussed with:   Anesthesia Plan Comments:         Anesthesia Quick Evaluation

## 2012-04-25 NOTE — Op Note (Signed)
EGD PROCEDURE REPORT  PATIENT:  [name]  MR#:  [id number] Birthdate:  22-Feb-1960, 52 y.o., male Endoscopist:  Dr. Malissa Hippo, MD Referred By:  Dr. Milana Obey, MD Procedure Date: 04/25/2012  Procedure:   EGD & Colonoscopy  Indications:  Patient is 52 year old Caucasian male who presents with epigastric pain along with postprandial nausea anorexia weight loss as well as chronic left-sided abdominal pain. Workup is negative including CBC, comprehensive chemistry panel as well as abdominopelvic CT and ultrasound. He has not responded to PPI therapy.            Informed Consent:  The risks, benefits, alternatives & imponderables which include, but are not limited to, bleeding, infection, perforation, drug reaction and potential missed lesion have been reviewed.  The potential for biopsy, lesion removal, esophageal dilation, etc. have also been discussed.  Questions have been answered.  All parties agreeable.  Please see history & physical in medical record for more information.  Medications:  Propofol IV administered by anesthesia team. Cetacaine spray topically for oropharyngeal anesthesia  EGD  Description of procedure:  The endoscope was introduced through the mouth and advanced to the second portion of the duodenum without difficulty or limitations. The mucosal surfaces were surveyed very carefully during advancement of the scope and upon withdrawal.  Findings:  Esophagus:  Mucosa of the esophagus was normal. GE junction was unremarkable. GEJ:  41 cm Stomach:  Stomach was empty and distended very well with insufflation. Folds in the proximal stomach are normal. Examination of mucosa at body was normal. two small antral erosions noted. No ulcer crater was present. Pyloric channel was patent. Angularis fundus and cardia were examined by retroflexing the scope and were normal. Duodenum:  Normal bulbar and post bulbar mucosa.  Therapeutic/Diagnostic Maneuvers Performed:   None  COLONOSCOPY Description of procedure:  After a digital rectal exam was performed, that colonoscope was advanced from the anus through the rectum and colon to the area of the cecum, ileocecal valve and appendiceal orifice. The cecum was deeply intubated. These structures were well-seen and photographed for the record. From the level of the cecum and ileocecal valve, the scope was slowly and cautiously withdrawn. The mucosal surfaces were carefully surveyed utilizing scope tip to flexion to facilitate fold flattening as needed. The scope was pulled down into the rectum where a thorough exam including retroflexion was performed.  Findings:   Prep satisfactory. Normal mucosa all the way from cecum to rectum. Small hemorrhoids below the dentate line along with anal papillae.  Therapeutic/Diagnostic Maneuvers Performed:  None  Complications:  None  Cecal Withdrawal Time:  6 minutes  Impression:  Two small antral erosions otherwise normal esophagogastroduodenoscopy. Please note stool H. pylori antigen one month ago was negative. Normal colonoscopy except single small anal papilla and external hemorrhoids. Suspect left mid abdominal pain may be referred from his back.   Recommendations:  Continue pantoprazole at current dose. Dicyclomine 10 mg by mouth twice a day. Office visit in one month.  REHMAN,NAJEEB U  04/25/2012 8:20 AM  CC: Dr. Milana Obey, MD & Dr. Sudie Bailey, Mila Homer, MD

## 2012-04-26 ENCOUNTER — Encounter (HOSPITAL_COMMUNITY): Payer: Self-pay | Admitting: Internal Medicine

## 2012-05-06 ENCOUNTER — Telehealth (INDEPENDENT_AMBULATORY_CARE_PROVIDER_SITE_OTHER): Payer: Self-pay | Admitting: *Deleted

## 2012-05-06 NOTE — Telephone Encounter (Signed)
Forwarded to Dr.Rehman for review. 

## 2012-05-06 NOTE — Telephone Encounter (Signed)
duplicate

## 2012-05-06 NOTE — Telephone Encounter (Signed)
Patient was given dicyclomine 10 mg after his procedure on 04/30/12. It is not helping at all. He is still nauseated, trouble eating and dizzy. His return phone number is [phone].  

## 2012-05-06 NOTE — Telephone Encounter (Addendum)
Patient was given dicyclomine 10 mg after his procedure on 04/30/12. It is not helping at all. He is still nauseated, trouble eating and dizzy. His return phone number is [phone].

## 2012-05-08 ENCOUNTER — Encounter (HOSPITAL_COMMUNITY): Payer: Self-pay | Admitting: *Deleted

## 2012-05-08 ENCOUNTER — Emergency Department (HOSPITAL_COMMUNITY)
Admission: EM | Admit: 2012-05-08 | Discharge: 2012-05-08 | Disposition: A | Discharge status: Home / Self Care | Payer: Medicaid Other | Attending: Emergency Medicine | Admitting: Emergency Medicine

## 2012-05-08 ENCOUNTER — Emergency Department (HOSPITAL_COMMUNITY): Payer: Medicaid Other

## 2012-05-08 DIAGNOSIS — R0789 Other chest pain: Secondary | ICD-10-CM | POA: Insufficient documentation

## 2012-05-08 DIAGNOSIS — K219 Gastro-esophageal reflux disease without esophagitis: Secondary | ICD-10-CM | POA: Insufficient documentation

## 2012-05-08 DIAGNOSIS — R110 Nausea: Secondary | ICD-10-CM | POA: Insufficient documentation

## 2012-05-08 DIAGNOSIS — Z79899 Other long term (current) drug therapy: Secondary | ICD-10-CM | POA: Insufficient documentation

## 2012-05-08 DIAGNOSIS — Z880 Allergy status to penicillin: Secondary | ICD-10-CM | POA: Insufficient documentation

## 2012-05-08 DIAGNOSIS — F411 Generalized anxiety disorder: Secondary | ICD-10-CM | POA: Insufficient documentation

## 2012-05-08 DIAGNOSIS — R079 Chest pain, unspecified: Secondary | ICD-10-CM

## 2012-05-08 DIAGNOSIS — Z8739 Personal history of other diseases of the musculoskeletal system and connective tissue: Secondary | ICD-10-CM | POA: Insufficient documentation

## 2012-05-08 DIAGNOSIS — R5381 Other malaise: Secondary | ICD-10-CM | POA: Insufficient documentation

## 2012-05-08 DIAGNOSIS — Z87891 Personal history of nicotine dependence: Secondary | ICD-10-CM | POA: Insufficient documentation

## 2012-05-08 LAB — CBC WITH DIFFERENTIAL/PLATELET
Eosinophils Relative: 2 % (ref 0–5)
HCT: 39.9 % (ref 39.0–52.0)
Hemoglobin: 13.5 g/dL (ref 13.0–17.0)
Lymphocytes Relative: 23 % (ref 12–46)
Lymphs Abs: 1 10*3/uL (ref 0.7–4.0)
MCV: 88.7 fL (ref 78.0–100.0)
Monocytes Absolute: 0.3 10*3/uL (ref 0.1–1.0)
Monocytes Relative: 7 % (ref 3–12)
Neutro Abs: 2.9 10*3/uL (ref 1.7–7.7)
WBC: 4.3 10*3/uL (ref 4.0–10.5)

## 2012-05-08 LAB — BASIC METABOLIC PANEL
BUN: 7 mg/dL (ref 6–23)
CO2: 29 mEq/L (ref 19–32)
Calcium: 8.8 mg/dL (ref 8.4–10.5)
Chloride: 105 mEq/L (ref 96–112)
Creatinine, Ser: 1.1 mg/dL (ref 0.50–1.35)
Glucose, Bld: 93 mg/dL (ref 70–99)

## 2012-05-08 LAB — TROPONIN I: Troponin I: <0.3 ng/mL (ref <0.30)

## 2012-05-08 MED ORDER — HYDROMORPHONE HCL PF 1 MG/ML IJ SOLN
1.0000 mg | Freq: Once | INTRAMUSCULAR | Status: AC
  Administered 2012-05-08: 1 mg via INTRAVENOUS
  Filled 2012-05-08: qty 1

## 2012-05-08 MED ORDER — GI COCKTAIL ~~LOC~~
30.0000 mL | Freq: Once | ORAL | Status: AC
  Administered 2012-05-08: 30 mL via ORAL
  Filled 2012-05-08: qty 30

## 2012-05-08 MED ORDER — KETOROLAC TROMETHAMINE 30 MG/ML IJ SOLN
30.0000 mg | Freq: Once | INTRAMUSCULAR | Status: AC
  Administered 2012-05-08: 30 mg via INTRAVENOUS
  Filled 2012-05-08: qty 1

## 2012-05-08 NOTE — ED Notes (Signed)
Recent dx of IBS and taking Dicyclomine
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
## 2012-05-08 NOTE — ED Notes (Signed)
Pt with mid CP x 3 days, one SL NTG ( without  Relief) and 324mg  ASA given en route, hx of GERD and has not been taking Protonix

## 2012-05-10 NOTE — Telephone Encounter (Signed)
Message left on answering service Patient needs office visit next week

## 2012-05-11 ENCOUNTER — Telehealth (INDEPENDENT_AMBULATORY_CARE_PROVIDER_SITE_OTHER): Payer: Self-pay | Admitting: *Deleted

## 2012-05-11 NOTE — Telephone Encounter (Signed)
Per Dr. Karilyn Cota, patient was to be seen next week. Apt was scheduled for 05/17/12 at 9:15 am. Becky from Dr. Michelle Nasuti office called to cancel the apt per Dr. Sudie Bailey. [name] was seen in there office today. Dr. Sudie Bailey was not taking the patient away but [name] has a lot of issues going on. He prescribed the dicyclomine 10 mg for [name] and wanted him to keep his future f/u apt.

## 2012-05-11 NOTE — Telephone Encounter (Signed)
Apt has been scheduled for 05/17/12 at 9:15 am with Dr. Karilyn Cota.

## 2012-05-11 NOTE — Telephone Encounter (Signed)
Noted  

## 2012-05-17 ENCOUNTER — Ambulatory Visit (INDEPENDENT_AMBULATORY_CARE_PROVIDER_SITE_OTHER): Payer: Medicaid Other | Admitting: Internal Medicine

## 2012-06-07 ENCOUNTER — Other Ambulatory Visit (HOSPITAL_COMMUNITY): Payer: Self-pay | Admitting: Family Medicine

## 2012-06-07 DIAGNOSIS — R1013 Epigastric pain: Secondary | ICD-10-CM

## 2012-06-08 ENCOUNTER — Encounter (HOSPITAL_COMMUNITY): Payer: Self-pay

## 2012-06-08 ENCOUNTER — Encounter (HOSPITAL_COMMUNITY)
Admission: RE | Admit: 2012-06-08 | Discharge: 2012-06-08 | Disposition: A | Discharge status: Home / Self Care | Payer: Medicaid Other | Source: Ambulatory Visit | Attending: Family Medicine | Admitting: Family Medicine

## 2012-06-08 DIAGNOSIS — R1013 Epigastric pain: Secondary | ICD-10-CM | POA: Insufficient documentation

## 2012-06-08 MED ORDER — TECHNETIUM TC 99M MEBROFENIN IV KIT
5.0000 | PACK | Freq: Once | INTRAVENOUS | Status: AC | PRN
  Administered 2012-06-08: 5 via INTRAVENOUS

## 2012-06-08 MED ORDER — SINCALIDE 5 MCG IJ SOLR
INTRAMUSCULAR | Status: AC
  Administered 2012-06-08: 1.5 ug via INTRAVENOUS
  Filled 2012-06-08: qty 5

## 2012-06-13 ENCOUNTER — Ambulatory Visit (INDEPENDENT_AMBULATORY_CARE_PROVIDER_SITE_OTHER): Payer: Medicaid Other | Admitting: Internal Medicine

## 2012-06-13 ENCOUNTER — Encounter (INDEPENDENT_AMBULATORY_CARE_PROVIDER_SITE_OTHER): Payer: Self-pay | Admitting: Internal Medicine

## 2012-06-13 VITALS — BP 102/66 | HR 68 | Temp 97.7°F | Resp 18 | Ht 72.0 in | Wt 160.8 lb

## 2012-06-13 DIAGNOSIS — R531 Weakness: Secondary | ICD-10-CM

## 2012-06-13 DIAGNOSIS — R1013 Epigastric pain: Secondary | ICD-10-CM

## 2012-06-13 DIAGNOSIS — R5383 Other fatigue: Secondary | ICD-10-CM

## 2012-06-13 DIAGNOSIS — R634 Abnormal weight loss: Secondary | ICD-10-CM

## 2012-06-13 LAB — CORTISOL: Cortisol, Plasma: 9.9 ug/dL

## 2012-06-13 LAB — CBC
HCT: 43.9 % (ref 39.0–52.0)
Hemoglobin: 15 g/dL (ref 13.0–17.0)
MCV: 86.2 fL (ref 78.0–100.0)
Platelets: 166 10*3/uL (ref 150–400)
RBC: 5.09 MIL/uL (ref 4.22–5.81)
WBC: 5.3 10*3/uL (ref 4.0–10.5)

## 2012-06-13 LAB — COMPREHENSIVE METABOLIC PANEL
ALT: 9 U/L (ref 0–53)
CO2: 29 mEq/L (ref 19–32)
Calcium: 9.6 mg/dL (ref 8.4–10.5)
Chloride: 101 mEq/L (ref 96–112)
Creat: 1.19 mg/dL (ref 0.50–1.35)
Glucose, Bld: 80 mg/dL (ref 70–99)
Total Protein: 6.9 g/dL (ref 6.0–8.3)

## 2012-06-13 LAB — SEDIMENTATION RATE: Sed Rate: 1 mm/hr (ref 0–16)

## 2012-06-13 MED ORDER — DICYCLOMINE HCL 10 MG PO CAPS: 10.0000 mg | ORAL_CAPSULE | Freq: Two times a day (BID) | ORAL | Status: DC

## 2012-06-13 MED ORDER — MEGESTROL ACETATE 40 MG PO TABS: 200.0000 mg/d | ORAL_TABLET | Freq: Every day | ORAL | Status: DC

## 2012-06-13 NOTE — Patient Instructions (Addendum)
Take dicyclomine 20 mg by mouth 30 minutes before breakfast and lunch. Please do not eat the foods that make you sick and stick with the ones that don't. Physician will contact you with results of blood work. Weight check in one month

## 2012-06-13 NOTE — Progress Notes (Signed)
Presenting complaint;  Abdominal pain, weakness and weight loss.  Subjective:  Patient is 52 year old Caucasian male who presents for scheduled visit. Following his last visit 2 months ago he underwent EGD and colonoscopy 6 weeks ago. He had few antral erosions. Prior stool antigen for H. pylori he was negative. Colonoscopy was unremarkable. He was begun on dicyclomine. He was continued to experience upper abdominal pain postprandial weakness and nausea. Dicyclomine dose was doubled by Dr. Sudie Bailey. He feels some better. He still has these symptoms with certain foods. He says he can eat [place] food and does not get sick but if he eats food from [employer] he gets abdominal pain and feels extremely weak and can't function and is lying in bed for a while. He has lost another 5 pounds since his last visit. While he has nausea he does not ever vomit. His bowels move daily. He denies melena or rectal bleeding. He states he does not feel not under the left rib cage since his colonoscopy. He had a HIDA scan with [facility] by Dr. [name] on 06/08/2012. EF was estimated to be 98.2% but no symptom reproduction.  Current Medications: Current Outpatient Prescriptions  Medication Sig Dispense Refill  . alprazolam (XANAX) 2 MG tablet Take 2 mg by mouth 4 (four) times daily.        Marland Kitchen dicyclomine (BENTYL) 10 MG capsule Take 1 capsule (10 mg total) by mouth 2 (two) times daily before a meal.  60 capsule  3  . HYDROcodone-acetaminophen (NORCO) 10-325 MG per tablet Take 1-2 tablets by mouth every 6 (six) hours as needed. For pain       . pantoprazole (PROTONIX) 40 MG tablet Take 40 mg by mouth daily.       No current facility-administered medications for this visit.     Objective: Blood pressure 102/66, pulse 68, temperature 97.7 F (36.5 C), temperature source Oral, resp. rate 18, height 6' (1.829 m), weight 160 lb 12.8 oz (72.938 kg). Patient is alert and in no acute distress. Conjunctiva is  pink. Sclera is nonicteric Oropharyngeal mucosa is normal. No neck masses or thyromegaly noted. Cardiac exam with regular rhythm normal S1 and S2. No murmur or gallop noted. Lungs are clear to auscultation. Abdomen is flat. Bowel sounds are normal. No bruits noted. He has mild generalized tenderness which is more so across upper abdomen. No organomegaly or masses.  No LE edema or clubbing noted.    Assessment:  #1. Weight loss possibly related to diminished calorie intake. #2. Abdominal pain with postprandial weakness and nausea. These symptoms are suggestive of dumping syndrome and he is somewhat improved with dicyclomine and not having any side effects. Recent HIDA scan revealed almost complete emptying of the gallbladder significance of which is not clear but his symptoms were not reproduced.    Plan:  Cortisol level and sedimentation rate. CBC and comprehensive chemistry panel. Change dicyclomine dose to 20 mg before breakfast and 20 mg before lunch Megace 200 mg by mouth daily. Weight check in one month and office visit in 2 months.

## 2012-06-22 ENCOUNTER — Telehealth (INDEPENDENT_AMBULATORY_CARE_PROVIDER_SITE_OTHER): Payer: Self-pay | Admitting: *Deleted

## 2012-06-22 NOTE — Telephone Encounter (Signed)
Advised he needs to separate the Megace from his pain medication. There is no nausea or vomiting.  Encouraged to eat 3 meals a day. Will call back with a weight

## 2012-06-22 NOTE — Telephone Encounter (Signed)
Still not wanting to eat. He has no appetite with him taking megestrol 40 mg. Has a question if someone could please return his call at [phone].

## 2012-06-22 NOTE — Telephone Encounter (Signed)
[name]'s weight is 152.6 lbs.

## 2012-08-03 ENCOUNTER — Emergency Department (HOSPITAL_COMMUNITY): Payer: Medicaid Other

## 2012-08-03 ENCOUNTER — Emergency Department (HOSPITAL_COMMUNITY)
Admission: EM | Admit: 2012-08-03 | Discharge: 2012-08-04 | Disposition: A | Discharge status: Home / Self Care | Payer: Medicaid Other | Attending: Emergency Medicine | Admitting: Emergency Medicine

## 2012-08-03 ENCOUNTER — Encounter (HOSPITAL_COMMUNITY): Payer: Self-pay | Admitting: *Deleted

## 2012-08-03 DIAGNOSIS — Z87891 Personal history of nicotine dependence: Secondary | ICD-10-CM | POA: Insufficient documentation

## 2012-08-03 DIAGNOSIS — Z8739 Personal history of other diseases of the musculoskeletal system and connective tissue: Secondary | ICD-10-CM | POA: Insufficient documentation

## 2012-08-03 DIAGNOSIS — Z79899 Other long term (current) drug therapy: Secondary | ICD-10-CM | POA: Insufficient documentation

## 2012-08-03 DIAGNOSIS — R42 Dizziness and giddiness: Secondary | ICD-10-CM | POA: Insufficient documentation

## 2012-08-03 DIAGNOSIS — R209 Unspecified disturbances of skin sensation: Secondary | ICD-10-CM | POA: Insufficient documentation

## 2012-08-03 DIAGNOSIS — R110 Nausea: Secondary | ICD-10-CM | POA: Insufficient documentation

## 2012-08-03 DIAGNOSIS — R5383 Other fatigue: Secondary | ICD-10-CM | POA: Insufficient documentation

## 2012-08-03 DIAGNOSIS — R5381 Other malaise: Secondary | ICD-10-CM | POA: Insufficient documentation

## 2012-08-03 DIAGNOSIS — Z8719 Personal history of other diseases of the digestive system: Secondary | ICD-10-CM | POA: Insufficient documentation

## 2012-08-03 DIAGNOSIS — R51 Headache: Secondary | ICD-10-CM | POA: Insufficient documentation

## 2012-08-03 DIAGNOSIS — F411 Generalized anxiety disorder: Secondary | ICD-10-CM | POA: Insufficient documentation

## 2012-08-03 DIAGNOSIS — Z880 Allergy status to penicillin: Secondary | ICD-10-CM | POA: Insufficient documentation

## 2012-08-03 MED ORDER — ONDANSETRON HCL 4 MG/2ML IJ SOLN
4.0000 mg | Freq: Once | INTRAMUSCULAR | Status: AC
  Administered 2012-08-03: 4 mg via INTRAVENOUS
  Filled 2012-08-03: qty 2

## 2012-08-03 MED ORDER — HYDROMORPHONE HCL PF 1 MG/ML IJ SOLN
1.0000 mg | Freq: Once | INTRAMUSCULAR | Status: AC
  Administered 2012-08-03: 1 mg via INTRAVENOUS
  Filled 2012-08-03: qty 1

## 2012-08-03 NOTE — ED Notes (Addendum)
Pt reporting headache starting this morning, reporting pain behind eyes and feeling like face is swollen.  Reporting dizziness and feeling weak. Reports being taking off of bentyl yesterday.

## 2012-08-03 NOTE — ED Notes (Signed)
States that" his eyeballs feeling like someone is stabbing him in them". States that he feels so weak and he is hot at this time. Still feel;ing dizzy. States this is the worse pain that he has ever had.

## 2012-08-03 NOTE — ED Notes (Signed)
Patient  Lights cut off . States the bright light hurts his eyes and makes his head hurt worse

## 2012-08-04 LAB — CBC WITH DIFFERENTIAL/PLATELET
Basophils Absolute: 0 K/uL (ref 0.0–0.1)
Basophils Relative: 0 % (ref 0–1)
Eosinophils Absolute: 0.1 K/uL (ref 0.0–0.7)
Eosinophils Relative: 3 % (ref 0–5)
HCT: 34.4 % — ABNORMAL LOW (ref 39.0–52.0)
Hemoglobin: 11.6 g/dL — ABNORMAL LOW (ref 13.0–17.0)
Lymphocytes Relative: 28 % (ref 12–46)
Lymphs Abs: 1.4 K/uL (ref 0.7–4.0)
MCH: 30.4 pg (ref 26.0–34.0)
MCHC: 33.7 g/dL (ref 30.0–36.0)
MCV: 90.1 fL (ref 78.0–100.0)
Monocytes Absolute: 0.3 K/uL (ref 0.1–1.0)
Monocytes Relative: 7 % (ref 3–12)
Neutro Abs: 3 K/uL (ref 1.7–7.7)
Neutrophils Relative %: 62 % (ref 43–77)
Platelets: 147 K/uL — ABNORMAL LOW (ref 150–400)
RBC: 3.82 MIL/uL — ABNORMAL LOW (ref 4.22–5.81)
RDW: 13.8 % (ref 11.5–15.5)
WBC: 4.8 K/uL (ref 4.0–10.5)

## 2012-08-04 LAB — BASIC METABOLIC PANEL WITH GFR
BUN: 8 mg/dL (ref 6–23)
CO2: 27 meq/L (ref 19–32)
Calcium: 8.2 mg/dL — ABNORMAL LOW (ref 8.4–10.5)
Chloride: 106 meq/L (ref 96–112)
Creatinine, Ser: 1.08 mg/dL (ref 0.50–1.35)
GFR calc Af Amer: 89 mL/min — ABNORMAL LOW
GFR calc non Af Amer: 77 mL/min — ABNORMAL LOW
Glucose, Bld: 111 mg/dL — ABNORMAL HIGH (ref 70–99)
Potassium: 3.9 meq/L (ref 3.5–5.1)
Sodium: 138 meq/L (ref 135–145)

## 2012-08-04 MED ORDER — KETOROLAC TROMETHAMINE 30 MG/ML IJ SOLN
30.0000 mg | Freq: Once | INTRAMUSCULAR | Status: AC
  Administered 2012-08-04: 30 mg via INTRAVENOUS
  Filled 2012-08-04: qty 1
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
## 2012-08-05 NOTE — ED Provider Notes (Signed)
Medical screening examination/treatment/procedure(s) were performed by non-physician practitioner and as supervising physician I was immediately available for consultation/collaboration.  Nicoletta Dress. Colon Branch, MD 08/05/12 [phone]

## 2012-08-08 ENCOUNTER — Encounter (HOSPITAL_COMMUNITY)
Admission: RE | Admit: 2012-08-08 | Discharge: 2012-08-08 | Disposition: A | Discharge status: Home / Self Care | Payer: Medicaid Other | Source: Ambulatory Visit | Attending: "Endocrinology | Admitting: "Endocrinology

## 2012-08-08 DIAGNOSIS — R51 Headache: Secondary | ICD-10-CM | POA: Insufficient documentation

## 2012-08-08 DIAGNOSIS — R42 Dizziness and giddiness: Secondary | ICD-10-CM | POA: Insufficient documentation

## 2012-08-08 NOTE — Progress Notes (Signed)
Unable to do ACTH test due to pt not being NPO. Will return in the am for exam. Dr. Fransico Him aware

## 2012-08-09 ENCOUNTER — Encounter (HOSPITAL_COMMUNITY)
Admission: RE | Admit: 2012-08-09 | Discharge: 2012-08-09 | Disposition: A | Discharge status: Home / Self Care | Payer: Medicaid Other | Source: Ambulatory Visit | Attending: "Endocrinology | Admitting: "Endocrinology

## 2012-08-09 MED ORDER — COSYNTROPIN 0.25 MG IJ SOLR
0.2500 mg | Freq: Once | INTRAMUSCULAR | Status: AC
  Administered 2012-08-09: 0.25 mg via INTRAVENOUS
  Filled 2012-08-09: qty 0.25

## 2012-08-09 NOTE — Progress Notes (Signed)
Stated NPO; Tolerated Lab draws and Injection well. Instructions given with understanding. D/C home

## 2012-08-10 NOTE — Progress Notes (Signed)
Results for JUSHUA, WALTMAN (MRN [id number]) as of 08/10/2012 11:36 Clinic labs ACTH  Ref. Range 08/09/2012 09:00  Cortisol, Base No range found 13.5  Cortisol, 30 Min Latest Range: >20 ug/dL 78.2 (L)  Cortisol, 60 Min Latest Range: >20 ug/dL 95.6  Results for NGUYEN, TODOROV (MRN [id number]) as of 08/10/2012 11:36  Ref. Range 08/09/2012 09:00  TSH Latest Range: 0.350-4.500 uIU/mL 2.226  T4, Total Latest Range: 5.0-12.5 ug/dL 8.8

## 2012-08-11 LAB — ACTH STIMULATION, 3 TIME POINTS: Cortisol, 30 Min: 19 ug/dL — ABNORMAL LOW (ref >20)

## 2012-09-05 ENCOUNTER — Ambulatory Visit (INDEPENDENT_AMBULATORY_CARE_PROVIDER_SITE_OTHER): Payer: Medicaid Other | Admitting: Internal Medicine

## 2012-11-14 ENCOUNTER — Encounter (INDEPENDENT_AMBULATORY_CARE_PROVIDER_SITE_OTHER): Payer: Self-pay | Admitting: Internal Medicine

## 2012-11-14 ENCOUNTER — Ambulatory Visit (INDEPENDENT_AMBULATORY_CARE_PROVIDER_SITE_OTHER): Payer: Medicaid Other | Admitting: Internal Medicine

## 2012-11-14 VITALS — BP 124/88 | HR 76 | Temp 97.3°F | Resp 18 | Ht 72.0 in | Wt 180.8 lb

## 2012-11-14 DIAGNOSIS — R531 Weakness: Secondary | ICD-10-CM

## 2012-11-14 DIAGNOSIS — K589 Irritable bowel syndrome without diarrhea: Secondary | ICD-10-CM

## 2012-11-14 DIAGNOSIS — R5381 Other malaise: Secondary | ICD-10-CM

## 2012-11-14 DIAGNOSIS — R634 Abnormal weight loss: Secondary | ICD-10-CM

## 2012-11-14 MED ORDER — DICYCLOMINE HCL 10 MG PO CAPS: 10.0000 mg | ORAL_CAPSULE | Freq: Three times a day (TID) | ORAL | Status: DC

## 2012-11-14 NOTE — Progress Notes (Signed)
Presenting complaint;  Left upper quadrant abdominal pain and rumbling. Weakness and weight loss.  Subjective:  Patient is 52 year old Caucasian male who is here for scheduled visit. He was last seen on 06-2012 for weight loss abdominal pain and postprandial nausea. [facility] EGD and colonoscopy back in April 2014 was unremarkable. Some of patient's symptoms were suggestive of dumping syndrome and dicyclomine dose was doubled to 20 mg twice a day. His sedimentation rate was 1 and fasting cortisol level was 9.9 ug/dL. He was begun on Megace. He stopped Megace after 6 weeks or so because he could not tell any difference. He has continued to complain of profound weakness. Patient was referred to Dr. Fransico Him and he is undergoing evaluation to rule out Addison's disease. He's presently on low-dose prednisone. His appetite has improved and he has gained 20 pounds since his last visit. However he continues to complain of extreme weakness and exertional dyspnea and states he cannot play with his grandchildren. He denies vomiting fever chills night sweats melena or rectal bleeding. He stopped dicyclomine and gurgling and discomfort in his left upper quadrant is back. He therefore like to get new prescription.   Current Medications: Current Outpatient Prescriptions  Medication Sig Dispense Refill  . alprazolam (XANAX) 2 MG tablet Take 2 mg by mouth 4 (four) times daily.        Marland Kitchen amoxicillin (AMOXIL) 500 MG capsule Take 500 mg by mouth 3 (three) times daily. Patient is taking prior to oral surgery      . doxepin (SINEQUAN) 25 MG capsule Take 100 mg by mouth at bedtime.       Marland Kitchen HYDROcodone-acetaminophen (NORCO) 10-325 MG per tablet Take 1-2 tablets by mouth every 6 (six) hours as needed. For pain       . pantoprazole (PROTONIX) 40 MG tablet Take 40 mg by mouth daily.      . predniSONE (DELTASONE) 5 MG tablet Take 5 mg by mouth daily.       No current facility-administered medications for this visit.      Objective: Blood pressure 124/88, pulse 76, temperature 97.3 F (36.3 C), temperature source Oral, resp. rate 18, height 6' (1.829 m), weight 180 lb 12.8 oz (82.01 kg). Patient is alert and very vocal and talking nonstop. Conjunctiva is pink. Sclera is nonicteric Oropharyngeal mucosa is normal. Dentition in poor condition in several teeth missing. No neck masses or thyromegaly noted. Cardiac exam with regular rhythm normal S1 and S2. No murmur or gallop noted. Lungs are clear to auscultation. Abdomen. Bowel sounds are hyperactive. On palpation abdomen is soft and nontender without organomegaly or masses.  No LE edema or clubbing noted.    Assessment:  #1. Left-sided abdominal pain and gurgling responded to anti-spasmodic. These symptoms have relapsed off medication. Therefore he will go back on dicyclomine. #2. Weight loss. He has gained 20 pounds since his last visit 5 months ago. It remains to be seen if indeed he has Addison's disease. Fasting cortisol level V months ago was within normal limits. #3. Profound weakness. This symptom remains unexplained. TSH 3 months ago was within normal limits. He may benefit from echocardiography but will leave it up to Dr. Sudie Bailey.   Plan:  New prescription given for dicyclomine 10 mg by mouth 4 times a day or 20 mg by mouth twice a day. Consider echocardiography. Office visit in 6 months.

## 2012-11-14 NOTE — Patient Instructions (Signed)
Dicyclomine 10 mg; can take 3 or 4 times daily.

## 2013-01-05 ENCOUNTER — Encounter (HOSPITAL_COMMUNITY): Payer: Self-pay | Admitting: Emergency Medicine

## 2013-01-05 ENCOUNTER — Emergency Department (HOSPITAL_COMMUNITY)
Admission: EM | Admit: 2013-01-05 | Discharge: 2013-01-05 | Disposition: A | Discharge status: Home / Self Care | Payer: Medicaid Other | Attending: Emergency Medicine | Admitting: Emergency Medicine

## 2013-01-05 DIAGNOSIS — M2550 Pain in unspecified joint: Secondary | ICD-10-CM | POA: Insufficient documentation

## 2013-01-05 DIAGNOSIS — R000 Tachycardia, unspecified: Secondary | ICD-10-CM | POA: Insufficient documentation

## 2013-01-05 DIAGNOSIS — G8918 Other acute postprocedural pain: Secondary | ICD-10-CM | POA: Insufficient documentation

## 2013-01-05 DIAGNOSIS — K089 Disorder of teeth and supporting structures, unspecified: Secondary | ICD-10-CM | POA: Insufficient documentation

## 2013-01-05 DIAGNOSIS — K0889 Other specified disorders of teeth and supporting structures: Secondary | ICD-10-CM

## 2013-01-05 DIAGNOSIS — R110 Nausea: Secondary | ICD-10-CM | POA: Insufficient documentation

## 2013-01-05 DIAGNOSIS — Z9089 Acquired absence of other organs: Secondary | ICD-10-CM | POA: Insufficient documentation

## 2013-01-05 DIAGNOSIS — Z880 Allergy status to penicillin: Secondary | ICD-10-CM | POA: Insufficient documentation

## 2013-01-05 DIAGNOSIS — Z87891 Personal history of nicotine dependence: Secondary | ICD-10-CM | POA: Insufficient documentation

## 2013-01-05 DIAGNOSIS — K219 Gastro-esophageal reflux disease without esophagitis: Secondary | ICD-10-CM | POA: Insufficient documentation

## 2013-01-05 DIAGNOSIS — Z79899 Other long term (current) drug therapy: Secondary | ICD-10-CM | POA: Insufficient documentation

## 2013-01-05 DIAGNOSIS — Z9889 Other specified postprocedural states: Secondary | ICD-10-CM | POA: Insufficient documentation

## 2013-01-05 DIAGNOSIS — M7981 Nontraumatic hematoma of soft tissue: Secondary | ICD-10-CM | POA: Insufficient documentation

## 2013-01-05 DIAGNOSIS — M549 Dorsalgia, unspecified: Secondary | ICD-10-CM | POA: Insufficient documentation

## 2013-01-05 DIAGNOSIS — F411 Generalized anxiety disorder: Secondary | ICD-10-CM | POA: Insufficient documentation

## 2013-01-05 DIAGNOSIS — IMO0002 Reserved for concepts with insufficient information to code with codable children: Secondary | ICD-10-CM | POA: Insufficient documentation

## 2013-01-05 MED ORDER — IBUPROFEN 800 MG PO TABS
800.0000 mg | ORAL_TABLET | Freq: Once | ORAL | Status: AC
  Administered 2013-01-05: 800 mg via ORAL
  Filled 2013-01-05: qty 1

## 2013-01-05 MED ORDER — CLINDAMYCIN HCL 150 MG PO CAPS
300.0000 mg | ORAL_CAPSULE | Freq: Once | ORAL | Status: AC
  Administered 2013-01-05: 300 mg via ORAL
  Filled 2013-01-05: qty 2

## 2013-01-05 MED ORDER — ONDANSETRON HCL 4 MG PO TABS
4.0000 mg | ORAL_TABLET | Freq: Once | ORAL | Status: AC
  Administered 2013-01-05: 4 mg via ORAL
  Filled 2013-01-05: qty 1

## 2013-01-05 NOTE — ED Provider Notes (Signed)
Medical screening examination/treatment/procedure(s) were performed by non-physician practitioner and as supervising physician I was immediately available for consultation/collaboration.  EKG Interpretation   None        Donnetta Hutching, MD 01/05/13 [phone]
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
## 2013-01-05 NOTE — ED Notes (Signed)
Pt had 23 teeth pulled on 12/23, did not get his scripts filled (antibiotic and ibu).  Thinks he may need an antibiotic.  And is worried about the swelling.

## 2013-01-17 ENCOUNTER — Encounter (HOSPITAL_COMMUNITY): Admission: RE | Admit: 2013-01-17 | Payer: Medicaid Other | Source: Ambulatory Visit

## 2013-01-17 ENCOUNTER — Encounter (HOSPITAL_COMMUNITY): Payer: Medicaid Other

## 2013-01-17 ENCOUNTER — Encounter (HOSPITAL_COMMUNITY)
Admission: RE | Admit: 2013-01-17 | Discharge: 2013-01-17 | Disposition: A | Discharge status: Home / Self Care | Payer: Medicaid Other | Source: Ambulatory Visit | Attending: "Endocrinology | Admitting: "Endocrinology

## 2013-05-15 ENCOUNTER — Ambulatory Visit (INDEPENDENT_AMBULATORY_CARE_PROVIDER_SITE_OTHER): Payer: Medicaid Other | Admitting: Internal Medicine

## 2013-08-05 ENCOUNTER — Emergency Department (HOSPITAL_COMMUNITY): Payer: Medicaid Other

## 2013-08-05 ENCOUNTER — Encounter (HOSPITAL_COMMUNITY): Payer: Self-pay | Admitting: Emergency Medicine

## 2013-08-05 ENCOUNTER — Emergency Department (HOSPITAL_COMMUNITY)
Admission: EM | Admit: 2013-08-05 | Discharge: 2013-08-05 | Disposition: A | Discharge status: Home / Self Care | Payer: Medicaid Other | Attending: Emergency Medicine | Admitting: Emergency Medicine

## 2013-08-05 DIAGNOSIS — R4789 Other speech disturbances: Secondary | ICD-10-CM | POA: Diagnosis not present

## 2013-08-05 DIAGNOSIS — G8929 Other chronic pain: Secondary | ICD-10-CM

## 2013-08-05 DIAGNOSIS — IMO0002 Reserved for concepts with insufficient information to code with codable children: Secondary | ICD-10-CM | POA: Insufficient documentation

## 2013-08-05 DIAGNOSIS — H539 Unspecified visual disturbance: Secondary | ICD-10-CM | POA: Insufficient documentation

## 2013-08-05 DIAGNOSIS — Z79899 Other long term (current) drug therapy: Secondary | ICD-10-CM | POA: Diagnosis not present

## 2013-08-05 DIAGNOSIS — Z87891 Personal history of nicotine dependence: Secondary | ICD-10-CM | POA: Diagnosis not present

## 2013-08-05 DIAGNOSIS — R5383 Other fatigue: Secondary | ICD-10-CM | POA: Diagnosis not present

## 2013-08-05 DIAGNOSIS — R519 Headache, unspecified: Secondary | ICD-10-CM

## 2013-08-05 DIAGNOSIS — Z9089 Acquired absence of other organs: Secondary | ICD-10-CM | POA: Insufficient documentation

## 2013-08-05 DIAGNOSIS — R471 Dysarthria and anarthria: Secondary | ICD-10-CM | POA: Diagnosis not present

## 2013-08-05 DIAGNOSIS — R5381 Other malaise: Secondary | ICD-10-CM | POA: Diagnosis not present

## 2013-08-05 DIAGNOSIS — F411 Generalized anxiety disorder: Secondary | ICD-10-CM | POA: Insufficient documentation

## 2013-08-05 DIAGNOSIS — Z880 Allergy status to penicillin: Secondary | ICD-10-CM | POA: Diagnosis not present

## 2013-08-05 DIAGNOSIS — K219 Gastro-esophageal reflux disease without esophagitis: Secondary | ICD-10-CM | POA: Diagnosis not present

## 2013-08-05 DIAGNOSIS — R51 Headache: Secondary | ICD-10-CM | POA: Diagnosis present

## 2013-08-05 DIAGNOSIS — Z8739 Personal history of other diseases of the musculoskeletal system and connective tissue: Secondary | ICD-10-CM | POA: Diagnosis not present

## 2013-08-05 DIAGNOSIS — R109 Unspecified abdominal pain: Secondary | ICD-10-CM | POA: Insufficient documentation

## 2013-08-05 DIAGNOSIS — Z9889 Other specified postprocedural states: Secondary | ICD-10-CM | POA: Diagnosis not present

## 2013-08-05 LAB — URINALYSIS, ROUTINE W REFLEX MICROSCOPIC
Bilirubin Urine: NEGATIVE
Glucose, UA: NEGATIVE mg/dL
KETONES UR: NEGATIVE mg/dL
LEUKOCYTES UA: NEGATIVE
NITRITE: NEGATIVE
PH: 6 (ref 5.0–8.0)
PROTEIN: NEGATIVE mg/dL
Specific Gravity, Urine: 1.01 (ref 1.005–1.030)
Urobilinogen, UA: 0.2 mg/dL (ref 0.0–1.0)

## 2013-08-05 LAB — COMPREHENSIVE METABOLIC PANEL
ALT: 14 U/L (ref 0–53)
AST: 31 U/L (ref 0–37)
Albumin: 4.4 g/dL (ref 3.5–5.2)
Alkaline Phosphatase: 83 U/L (ref 39–117)
Anion gap: 9 (ref 5–15)
BUN: 8 mg/dL (ref 6–23)
CALCIUM: 9.3 mg/dL (ref 8.4–10.5)
CO2: 29 meq/L (ref 19–32)
CREATININE: 1.42 mg/dL — AB (ref 0.50–1.35)
Chloride: 100 mEq/L (ref 96–112)
GFR, EST AFRICAN AMERICAN: 63 mL/min — AB (ref >90)
GFR, EST NON AFRICAN AMERICAN: 55 mL/min — AB (ref >90)
Glucose, Bld: 84 mg/dL (ref 70–99)
Potassium: 4.3 mEq/L (ref 3.7–5.3)
Sodium: 138 mEq/L (ref 137–147)
TOTAL PROTEIN: 7.3 g/dL (ref 6.0–8.3)
Total Bilirubin: 0.6 mg/dL (ref 0.3–1.2)

## 2013-08-05 LAB — CBC WITH DIFFERENTIAL/PLATELET
BASOS ABS: 0 10*3/uL (ref 0.0–0.1)
BASOS PCT: 0 % (ref 0–1)
Eosinophils Absolute: 0.3 10*3/uL (ref 0.0–0.7)
Eosinophils Relative: 4 % (ref 0–5)
HCT: 41 % (ref 39.0–52.0)
Hemoglobin: 13.7 g/dL (ref 13.0–17.0)
Lymphocytes Relative: 12 % (ref 12–46)
Lymphs Abs: 0.9 10*3/uL (ref 0.7–4.0)
MCH: 30.2 pg (ref 26.0–34.0)
MCHC: 33.4 g/dL (ref 30.0–36.0)
MCV: 90.3 fL (ref 78.0–100.0)
Monocytes Absolute: 0.6 10*3/uL (ref 0.1–1.0)
Monocytes Relative: 8 % (ref 3–12)
NEUTROS ABS: 5.9 10*3/uL (ref 1.7–7.7)
NEUTROS PCT: 76 % (ref 43–77)
Platelets: 217 10*3/uL (ref 150–400)
RBC: 4.54 MIL/uL (ref 4.22–5.81)
RDW: 14 % (ref 11.5–15.5)
WBC: 7.8 10*3/uL (ref 4.0–10.5)

## 2013-08-05 LAB — LIPASE, BLOOD: LIPASE: 42 U/L (ref 11–59)

## 2013-08-05 LAB — URINE MICROSCOPIC-ADD ON

## 2013-08-05 NOTE — Discharge Instructions (Signed)

## 2013-08-05 NOTE — ED Notes (Signed)
MRI scheduled by Peyton NajjarLarry for 2pm on Monday, July 27.
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
## 2013-08-05 NOTE — ED Notes (Signed)
Patient with no complaints at this time. Respirations even and unlabored. Skin warm/dry. Discharge instructions reviewed with patient at this time. Patient given opportunity to voice concerns/ask questions. Patient discharged at this time and left Emergency Department with steady gait.   

## 2013-08-05 NOTE — ED Provider Notes (Signed)
Medical screening examination/treatment/procedure(s) were performed by non-physician practitioner and as supervising physician I was immediately available for consultation/collaboration.   EKG Interpretation None      Devoria AlbeIva Margerie Fraiser, MD, Armando GangFACEP   Ward GivensIva L Ephriam Turman, MD 08/05/13 1254

## 2013-08-05 NOTE — ED Notes (Signed)
Patient MRI scheduled for 2 pm Monday 08/07/13. Patient aware.

## 2013-08-05 NOTE — ED Notes (Signed)
Pt c/o headache and blurred vision x1-2 days. Pt also states "soemtime when I talk I can't say the words I want to". Pt states he's "had trouble speaking" x8-10 months.

## 2013-08-07 ENCOUNTER — Ambulatory Visit (HOSPITAL_COMMUNITY): Admit: 2013-08-07 | Payer: Medicaid Other

## 2013-08-15 ENCOUNTER — Other Ambulatory Visit (HOSPITAL_COMMUNITY): Payer: Self-pay | Admitting: Family Medicine

## 2013-08-15 DIAGNOSIS — G459 Transient cerebral ischemic attack, unspecified: Secondary | ICD-10-CM

## 2013-08-16 ENCOUNTER — Ambulatory Visit (HOSPITAL_COMMUNITY)
Admission: RE | Admit: 2013-08-16 | Discharge: 2013-08-16 | Disposition: A | Discharge status: Home / Self Care | Payer: Medicaid Other | Source: Ambulatory Visit | Attending: Family Medicine | Admitting: Family Medicine

## 2013-08-16 DIAGNOSIS — I658 Occlusion and stenosis of other precerebral arteries: Secondary | ICD-10-CM | POA: Diagnosis not present

## 2013-08-16 DIAGNOSIS — I6529 Occlusion and stenosis of unspecified carotid artery: Secondary | ICD-10-CM | POA: Diagnosis not present

## 2013-08-16 DIAGNOSIS — G459 Transient cerebral ischemic attack, unspecified: Secondary | ICD-10-CM

## 2013-08-16 DIAGNOSIS — I6350 Cerebral infarction due to unspecified occlusion or stenosis of unspecified cerebral artery: Secondary | ICD-10-CM | POA: Insufficient documentation

## 2013-08-22 ENCOUNTER — Other Ambulatory Visit (HOSPITAL_COMMUNITY): Payer: Self-pay | Admitting: Family Medicine

## 2013-08-22 DIAGNOSIS — G459 Transient cerebral ischemic attack, unspecified: Secondary | ICD-10-CM

## 2013-08-22 DIAGNOSIS — I6529 Occlusion and stenosis of unspecified carotid artery: Secondary | ICD-10-CM

## 2013-08-23 ENCOUNTER — Encounter (HOSPITAL_COMMUNITY): Payer: Self-pay | Admitting: Emergency Medicine

## 2013-08-23 ENCOUNTER — Emergency Department (HOSPITAL_COMMUNITY): Payer: Medicaid Other

## 2013-08-23 ENCOUNTER — Observation Stay (HOSPITAL_COMMUNITY)
Admission: EM | Admit: 2013-08-23 | Discharge: 2013-08-25 | Disposition: A | Discharge status: Home / Self Care | Payer: Medicaid Other | Attending: Internal Medicine | Admitting: Internal Medicine

## 2013-08-23 DIAGNOSIS — F411 Generalized anxiety disorder: Secondary | ICD-10-CM | POA: Diagnosis not present

## 2013-08-23 DIAGNOSIS — Z880 Allergy status to penicillin: Secondary | ICD-10-CM | POA: Insufficient documentation

## 2013-08-23 DIAGNOSIS — Z87891 Personal history of nicotine dependence: Secondary | ICD-10-CM | POA: Insufficient documentation

## 2013-08-23 DIAGNOSIS — IMO0002 Reserved for concepts with insufficient information to code with codable children: Secondary | ICD-10-CM | POA: Diagnosis not present

## 2013-08-23 DIAGNOSIS — K219 Gastro-esophageal reflux disease without esophagitis: Secondary | ICD-10-CM | POA: Diagnosis not present

## 2013-08-23 DIAGNOSIS — R4182 Altered mental status, unspecified: Secondary | ICD-10-CM | POA: Diagnosis present

## 2013-08-23 DIAGNOSIS — Z79899 Other long term (current) drug therapy: Secondary | ICD-10-CM | POA: Diagnosis not present

## 2013-08-23 DIAGNOSIS — M1990 Unspecified osteoarthritis, unspecified site: Secondary | ICD-10-CM | POA: Diagnosis not present

## 2013-08-23 DIAGNOSIS — R4701 Aphasia: Secondary | ICD-10-CM

## 2013-08-23 LAB — COMPREHENSIVE METABOLIC PANEL
ALBUMIN: 3.9 g/dL (ref 3.5–5.2)
ALK PHOS: 82 U/L (ref 39–117)
ALT: 9 U/L (ref 0–53)
AST: 18 U/L (ref 0–37)
Anion gap: 10 (ref 5–15)
BILIRUBIN TOTAL: 0.4 mg/dL (ref 0.3–1.2)
BUN: 8 mg/dL (ref 6–23)
CHLORIDE: 99 meq/L (ref 96–112)
CO2: 29 mEq/L (ref 19–32)
Calcium: 9 mg/dL (ref 8.4–10.5)
Creatinine, Ser: 1.5 mg/dL — ABNORMAL HIGH (ref 0.50–1.35)
GFR calc Af Amer: 59 mL/min — ABNORMAL LOW (ref >90)
GFR calc non Af Amer: 51 mL/min — ABNORMAL LOW (ref >90)
Glucose, Bld: 73 mg/dL (ref 70–99)
POTASSIUM: 4.1 meq/L (ref 3.7–5.3)
SODIUM: 138 meq/L (ref 137–147)
TOTAL PROTEIN: 6.8 g/dL (ref 6.0–8.3)

## 2013-08-23 LAB — ETHANOL

## 2013-08-23 LAB — URINALYSIS, ROUTINE W REFLEX MICROSCOPIC
BILIRUBIN URINE: NEGATIVE
Glucose, UA: NEGATIVE mg/dL
Ketones, ur: NEGATIVE mg/dL
Leukocytes, UA: NEGATIVE
NITRITE: NEGATIVE
Protein, ur: NEGATIVE mg/dL
UROBILINOGEN UA: 0.2 mg/dL (ref 0.0–1.0)
pH: 5.5 (ref 5.0–8.0)

## 2013-08-23 LAB — CBC WITH DIFFERENTIAL/PLATELET
BASOS ABS: 0 10*3/uL (ref 0.0–0.1)
Basophils Relative: 1 % (ref 0–1)
EOS ABS: 0.5 10*3/uL (ref 0.0–0.7)
EOS PCT: 8 % — AB (ref 0–5)
HCT: 42.2 % (ref 39.0–52.0)
Hemoglobin: 13.8 g/dL (ref 13.0–17.0)
LYMPHS ABS: 1.8 10*3/uL (ref 0.7–4.0)
Lymphocytes Relative: 30 % (ref 12–46)
MCH: 29.7 pg (ref 26.0–34.0)
MCHC: 32.7 g/dL (ref 30.0–36.0)
MCV: 90.8 fL (ref 78.0–100.0)
Monocytes Absolute: 0.5 10*3/uL (ref 0.1–1.0)
Monocytes Relative: 8 % (ref 3–12)
NEUTROS PCT: 53 % (ref 43–77)
Neutro Abs: 3.3 10*3/uL (ref 1.7–7.7)
PLATELETS: 202 10*3/uL (ref 150–400)
RBC: 4.65 MIL/uL (ref 4.22–5.81)
RDW: 14 % (ref 11.5–15.5)
WBC: 6.2 10*3/uL (ref 4.0–10.5)

## 2013-08-23 LAB — I-STAT CHEM 8, ED
BUN: 6 mg/dL (ref 6–23)
Calcium, Ion: 1.18 mmol/L (ref 1.12–1.23)
Chloride: 97 mEq/L (ref 96–112)
Creatinine, Ser: 1.6 mg/dL — ABNORMAL HIGH (ref 0.50–1.35)
Glucose, Bld: 70 mg/dL (ref 70–99)
HEMATOCRIT: 44 % (ref 39.0–52.0)
HEMOGLOBIN: 15 g/dL (ref 13.0–17.0)
Potassium: 3.9 mEq/L (ref 3.7–5.3)
SODIUM: 140 meq/L (ref 137–147)
TCO2: 29 mmol/L (ref 0–100)

## 2013-08-23 LAB — URINE MICROSCOPIC-ADD ON

## 2013-08-23 LAB — RAPID URINE DRUG SCREEN, HOSP PERFORMED
AMPHETAMINES: NOT DETECTED
Barbiturates: NOT DETECTED
Benzodiazepines: POSITIVE — AB
Cocaine: NOT DETECTED
Opiates: NOT DETECTED
TETRAHYDROCANNABINOL: NOT DETECTED

## 2013-08-23 LAB — CBG MONITORING, ED: GLUCOSE-CAPILLARY: 82 mg/dL (ref 70–99)

## 2013-08-23 LAB — ACETAMINOPHEN LEVEL: Acetaminophen (Tylenol), Serum: <15 ug/mL (ref 10–30)

## 2013-08-23 LAB — SALICYLATE LEVEL

## 2013-08-23 MED ORDER — NALOXONE HCL 0.4 MG/ML IJ SOLN
0.2000 mg | Freq: Once | INTRAMUSCULAR | Status: AC
  Administered 2013-08-23: 0.2 mg via INTRAVENOUS
  Filled 2013-08-23: qty 1
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
## 2013-08-23 NOTE — ED Notes (Signed)
Hospitalist in assessing patient at this time.

## 2013-08-23 NOTE — ED Notes (Signed)
Pt is very lethargic and is not answering any questions.

## 2013-08-24 ENCOUNTER — Encounter (HOSPITAL_COMMUNITY): Payer: Self-pay | Admitting: Emergency Medicine

## 2013-08-24 ENCOUNTER — Other Ambulatory Visit (HOSPITAL_COMMUNITY): Payer: Medicaid Other

## 2013-08-24 ENCOUNTER — Observation Stay (HOSPITAL_COMMUNITY): Payer: Medicaid Other

## 2013-08-24 ENCOUNTER — Ambulatory Visit (HOSPITAL_COMMUNITY): Payer: Medicaid Other | Attending: Family Medicine

## 2013-08-24 DIAGNOSIS — G459 Transient cerebral ischemic attack, unspecified: Secondary | ICD-10-CM

## 2013-08-24 LAB — GLUCOSE, CAPILLARY: Glucose-Capillary: 86 mg/dL (ref 70–99)

## 2013-08-24 LAB — LIPID PANEL
CHOL/HDL RATIO: 3.8 ratio
CHOLESTEROL: 154 mg/dL (ref 0–200)
HDL: 41 mg/dL (ref >39)
LDL Cholesterol: 94 mg/dL (ref 0–99)
Triglycerides: 94 mg/dL (ref <150)
VLDL: 19 mg/dL (ref 0–40)

## 2013-08-24 LAB — HEMOGLOBIN A1C
Hgb A1c MFr Bld: 5.4 % (ref <5.7)
Mean Plasma Glucose: 108 mg/dL (ref <117)

## 2013-08-24 MED ORDER — ASPIRIN 325 MG PO TABS
325.0000 mg | ORAL_TABLET | Freq: Every day | ORAL | Status: DC
  Administered 2013-08-24 – 2013-08-25 (×2): 325 mg via ORAL
  Filled 2013-08-24 (×2): qty 1

## 2013-08-24 MED ORDER — PREDNISONE 10 MG PO TABS
5.0000 mg | ORAL_TABLET | Freq: Every day | ORAL | Status: DC
  Administered 2013-08-24 – 2013-08-25 (×2): 5 mg via ORAL
  Filled 2013-08-24 (×2): qty 1

## 2013-08-24 MED ORDER — PANTOPRAZOLE SODIUM 40 MG PO TBEC
40.0000 mg | DELAYED_RELEASE_TABLET | Freq: Every day | ORAL | Status: DC
  Administered 2013-08-24 – 2013-08-25 (×2): 40 mg via ORAL
  Filled 2013-08-24 (×2): qty 1

## 2013-08-24 MED ORDER — ENOXAPARIN SODIUM 40 MG/0.4ML ~~LOC~~ SOLN
40.0000 mg | SUBCUTANEOUS | Status: DC
  Administered 2013-08-24 – 2013-08-25 (×2): 40 mg via SUBCUTANEOUS
  Filled 2013-08-24 (×2): qty 0.4

## 2013-08-24 MED ORDER — SODIUM CHLORIDE 0.9 % IV SOLN
INTRAVENOUS | Status: DC
  Administered 2013-08-24: 09:00:00 via INTRAVENOUS

## 2013-08-24 MED ORDER — ACETAMINOPHEN 325 MG PO TABS
650.0000 mg | ORAL_TABLET | ORAL | Status: DC | PRN
Start: 2013-08-24 — End: 2013-08-25
  Administered 2013-08-24: 650 mg via ORAL
  Filled 2013-08-24: qty 2

## 2013-08-24 MED ORDER — DOXEPIN HCL 25 MG PO CAPS
100.0000 mg | ORAL_CAPSULE | Freq: Every day | ORAL | Status: DC
  Administered 2013-08-24: 100 mg via ORAL
  Filled 2013-08-24: qty 4

## 2013-08-24 MED ORDER — HYDROCODONE-ACETAMINOPHEN 10-325 MG PO TABS
1.0000 | ORAL_TABLET | Freq: Four times a day (QID) | ORAL | Status: DC | PRN
  Administered 2013-08-24 – 2013-08-25 (×3): 2 via ORAL
  Filled 2013-08-24 (×3): qty 2

## 2013-08-24 MED ORDER — ALPRAZOLAM 1 MG PO TABS
2.0000 mg | ORAL_TABLET | Freq: Four times a day (QID) | ORAL | Status: DC | PRN
  Administered 2013-08-24 – 2013-08-25 (×3): 2 mg via ORAL
  Filled 2013-08-24 (×3): qty 2

## 2013-08-24 MED ORDER — STROKE: EARLY STAGES OF RECOVERY BOOK
Freq: Once | Status: DC
  Filled 2013-08-24: qty 1

## 2013-08-24 MED ORDER — DICYCLOMINE HCL 10 MG PO CAPS
20.0000 mg | ORAL_CAPSULE | Freq: Two times a day (BID) | ORAL | Status: DC
  Administered 2013-08-24 – 2013-08-25 (×3): 20 mg via ORAL
  Filled 2013-08-24 (×3): qty 2

## 2013-08-24 NOTE — Progress Notes (Signed)
  Echocardiogram 2D Echocardiogram has been performed.  [name] 08/24/2013, 3:46 PM

## 2013-08-24 NOTE — Progress Notes (Signed)
UR Completed Shermon Bozzi Graves-Bigelow, RN,BSN [phone]  
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
## 2013-08-24 NOTE — Progress Notes (Signed)
Alan SheriffRicky L [name] UJW:[id number]:[id number] DOB: [birth year] DOA: 08/23/2013 PCP: [name],[name], MD   Subjective: This man was admitted yesterday with what possibly could be a TIA versus stroke. Initial evaluation is unremarkable. He feels back to his normal self now. He does describe nominal dysphasia approximately 6 months ago also. He has been investigated as an outpatient but so far this has been negative. He describes nonspecific chest pain this morning.           Physical Exam: Blood pressure 109/74, pulse 63, temperature 97.7 F (36.5 C), temperature source Oral, resp. rate 20, height 5' 6.5" (1.689 m), weight 91.1 kg (200 lb 13.4 oz), SpO2 98.00%. He is alert and orientated without any focal neurological signs whatsoever his speech is normal. Heart sounds are present without murmurs. Lung fields are clear.   Investigations:  No results found for this or any previous visit (from the past 240 hour(s)).   Basic Metabolic Panel:  Recent Labs  56/21/3006/12/27 2103 08/23/13 2130  NA 138 140  K 4.1 3.9  CL 99 97  CO2 29  --   GLUCOSE 73 70  BUN 8 6  CREATININE 1.50* 1.60*  CALCIUM 9.0  --    Liver Function Tests:  Recent Labs  08/23/13 2103  AST 18  ALT 9  ALKPHOS 82  BILITOT 0.4  PROT 6.8  ALBUMIN 3.9     CBC:  Recent Labs  08/23/13 2103 08/23/13 2130  WBC 6.2  --   NEUTROABS 3.3  --   HGB 13.8 15.0  HCT 42.2 44.0  MCV 90.8  --   PLT 202  --     Dg Chest 1 View  08/23/2013   CLINICAL DATA:  Altered mental status.  Confusion.  EXAM: CHEST - 1 VIEW  COMPARISON:  Acute abdominal series 08/05/2013.  FINDINGS: Lung volumes are low. There is some linear bibasilar opacities (left greater than right), which are similar to prior examinations, favored to reflect areas of chronic scarring or subsegmental atelectasis. No definite acute consolidative airspace disease. No pleural effusions. No evidence of pulmonary edema. Heart size is normal. Mediastinal contours are  unremarkable.  IMPRESSION: 1. Low lung volumes with mild bibasilar subsegmental atelectasis and/or scarring.   Electronically Signed   By: [name]  Entrikin M.D.   On: 08/23/2013 21:33   Ct Head Wo Contrast  08/23/2013   CLINICAL DATA:  Altered mental status.  Possible drug overdose.  EXAM: CT HEAD WITHOUT CONTRAST  TECHNIQUE: Contiguous axial images were obtained from the base of the skull through the vertex without intravenous contrast.  COMPARISON:  Head CT 08/05/2013.  FINDINGS: Patchy and confluent areas of decreased attenuation are noted throughout the deep and periventricular white matter of the cerebral hemispheres bilaterally, compatible with chronic microvascular ischemic disease. No acute intracranial abnormalities. Specifically, no evidence of acute intracranial hemorrhage, no definite findings of acute/subacute cerebral ischemia, no mass, mass effect, hydrocephalus or abnormal intra or extra-axial fluid collections. Visualized paranasal sinuses and mastoids are well pneumatized. No acute displaced skull fractures are identified.  IMPRESSION: 1. No acute intracranial abnormalities. 2. Chronic microvascular ischemic changes again noted in the cerebral white matter.   Electronically Signed   By: [name]  Entrikin M.D.   On: 08/23/2013 21:42      Medications: I have reviewed the patient's current medications.  Impression: 1. Altered mental status, unclear etiology. Possible TIA versus CVA. 2. Nonspecific chest pain experiences morning. 3. Anxiety disorder on high-dose alprazolam. 4. A degree of dehydration.  Plan: 1. Await MRI brain scan that is scheduled to be done this morning. 2. Serial cardiac enzymes in case there is a cardiac source for his chest pain, although I doubt this. 3. Intravenous fluids for hydration. 4. Neurology consultation.  Consultants:  Await neurology consultation.   Procedures:  None.   Antibiotics:  None.                   Code Status: Full  code.  Family Communication: I discussed the plan with patient at the bedside.   Disposition Plan: Home when medically stable.  Time spent: 15 minutes.   LOS: 1 day   [name] C   08/24/2013, 9:00 AM

## 2013-08-25 ENCOUNTER — Observation Stay (HOSPITAL_COMMUNITY)
Admit: 2013-08-25 | Discharge: 2013-08-25 | Disposition: A | Discharge status: Home / Self Care | Payer: Medicaid Other | Attending: Neurology | Admitting: Neurology

## 2013-08-25 LAB — RPR

## 2013-08-25 LAB — GLUCOSE, CAPILLARY
GLUCOSE-CAPILLARY: 112 mg/dL — AB (ref 70–99)
Glucose-Capillary: 112 mg/dL — ABNORMAL HIGH (ref 70–99)

## 2013-08-25 LAB — COMPREHENSIVE METABOLIC PANEL
ALBUMIN: 3.5 g/dL (ref 3.5–5.2)
ALK PHOS: 69 U/L (ref 39–117)
ALT: 7 U/L (ref 0–53)
AST: 15 U/L (ref 0–37)
Anion gap: 10 (ref 5–15)
BUN: 7 mg/dL (ref 6–23)
CO2: 28 meq/L (ref 19–32)
CREATININE: 1.3 mg/dL (ref 0.50–1.35)
Calcium: 8.4 mg/dL (ref 8.4–10.5)
Chloride: 103 mEq/L (ref 96–112)
GFR calc Af Amer: 70 mL/min — ABNORMAL LOW (ref >90)
GFR, EST NON AFRICAN AMERICAN: 61 mL/min — AB (ref >90)
Glucose, Bld: 92 mg/dL (ref 70–99)
POTASSIUM: 3.8 meq/L (ref 3.7–5.3)
Sodium: 141 mEq/L (ref 137–147)
Total Bilirubin: 0.3 mg/dL (ref 0.3–1.2)
Total Protein: 6.3 g/dL (ref 6.0–8.3)

## 2013-08-25 LAB — CBC
HEMATOCRIT: 38.3 % — AB (ref 39.0–52.0)
Hemoglobin: 12.5 g/dL — ABNORMAL LOW (ref 13.0–17.0)
MCH: 29.6 pg (ref 26.0–34.0)
MCHC: 32.6 g/dL (ref 30.0–36.0)
MCV: 90.5 fL (ref 78.0–100.0)
PLATELETS: 176 10*3/uL (ref 150–400)
RBC: 4.23 MIL/uL (ref 4.22–5.81)
RDW: 13.8 % (ref 11.5–15.5)
WBC: 6 10*3/uL (ref 4.0–10.5)

## 2013-08-25 LAB — VITAMIN B12: Vitamin B-12: 291 pg/mL (ref 211–911)

## 2013-08-25 LAB — TSH: TSH: 9.26 u[IU]/mL — AB (ref 0.350–4.500)

## 2013-08-25 LAB — HIV ANTIBODY (ROUTINE TESTING W REFLEX): HIV 1&2 Ab, 4th Generation: NONREACTIVE

## 2013-08-25 LAB — HOMOCYSTEINE: Homocysteine: 66 umol/L — ABNORMAL HIGH (ref 4.0–15.4)

## 2013-08-25 MED ORDER — ASPIRIN 325 MG PO TABS: 325.0000 mg | ORAL_TABLET | Freq: Every day | ORAL | Status: DC

## 2013-08-25 NOTE — Discharge Summary (Signed)
Physician Discharge Summary  Patient ID: [name] MRN: [id number] DOB/AGE: February 24, 1960 53 y.o. Primary Care Physician:[name],[name], MD Admit date: 08/23/2013 Discharge date: 08/25/2013    Discharge Diagnoses:  1. Altered mental status with nominal dysphasia. No evidence of CVA. Etiology not entirely clear. EEG will be done prior to discharge. 2. Anxiety disorder with high-dose alprazolam use. 3. Acute renal failure secondary to dehydration, resolved with intravenous fluids.     Medication List         alprazolam 2 MG tablet  Commonly known as:  XANAX  Take 2 mg by mouth 4 (four) times daily.     aspirin 325 MG tablet  Take 1 tablet (325 mg total) by mouth daily.     dicyclomine 10 MG capsule  Commonly known as:  BENTYL  Take 20 mg by mouth 2 (two) times daily.     doxepin 100 MG capsule  Commonly known as:  SINEQUAN  Take 100 mg by mouth at bedtime.     HYDROcodone-acetaminophen 10-325 MG per tablet  Commonly known as:  NORCO  Take 1-2 tablets by mouth every 6 (six) hours as needed. For pain     pantoprazole 40 MG tablet  Commonly known as:  PROTONIX  Take 40 mg by mouth daily.     predniSONE 5 MG tablet  Commonly known as:  DELTASONE  Take 5 mg by mouth daily.        Discharged Condition: Stable and improved.    Consults: Neurology.  Significant Diagnostic Studies: Dg Chest 1 View  08/23/2013   CLINICAL DATA:  Altered mental status.  Confusion.  EXAM: CHEST - 1 VIEW  COMPARISON:  Acute abdominal series 08/05/2013.  FINDINGS: Lung volumes are low. There is some linear bibasilar opacities (left greater than right), which are similar to prior examinations, favored to reflect areas of chronic scarring or subsegmental atelectasis. No definite acute consolidative airspace disease. No pleural effusions. No evidence of pulmonary edema. Heart size is normal. Mediastinal contours are unremarkable.  IMPRESSION: 1. Low lung volumes with mild bibasilar  subsegmental atelectasis and/or scarring.   Electronically Signed   By: Trudie Reed M.D.   On: 08/23/2013 21:33   Ct Head Wo Contrast  08/23/2013   CLINICAL DATA:  Altered mental status.  Possible drug overdose.  EXAM: CT HEAD WITHOUT CONTRAST  TECHNIQUE: Contiguous axial images were obtained from the base of the skull through the vertex without intravenous contrast.  COMPARISON:  Head CT 08/05/2013.  FINDINGS: Patchy and confluent areas of decreased attenuation are noted throughout the deep and periventricular white matter of the cerebral hemispheres bilaterally, compatible with chronic microvascular ischemic disease. No acute intracranial abnormalities. Specifically, no evidence of acute intracranial hemorrhage, no definite findings of acute/subacute cerebral ischemia, no mass, mass effect, hydrocephalus or abnormal intra or extra-axial fluid collections. Visualized paranasal sinuses and mastoids are well pneumatized. No acute displaced skull fractures are identified.  IMPRESSION: 1. No acute intracranial abnormalities. 2. Chronic microvascular ischemic changes again noted in the cerebral white matter.   Electronically Signed   By: Trudie Reed M.D.   On: 08/23/2013 21:42   Ct Head Wo Contrast  08/05/2013   CLINICAL DATA:  Headache and blurry vision.  EXAM: CT HEAD WITHOUT CONTRAST  TECHNIQUE: Contiguous axial images were obtained from the base of the skull through the vertex without intravenous contrast.  COMPARISON:  PET-CT 08/04/2012.  FINDINGS: Patchy areas of decreased attenuation are noted throughout the deep and periventricular white matter of the  cerebral hemispheres bilaterally, compatible with chronic microvascular ischemic disease. No acute intracranial abnormalities. Specifically, no evidence of acute intracranial hemorrhage, no definite findings of acute/subacute cerebral ischemia, no mass, mass effect, hydrocephalus or abnormal intra or extra-axial fluid collections. Visualized  paranasal sinuses and mastoids are well pneumatized, with exception of some mild multifocal mucosal thickening throughout the ethmoid sinuses bilaterally. No acute displaced skull fractures are identified.  IMPRESSION: 1. No acute intracranial abnormalities. 2. Mild chronic microvascular ischemic changes in the cerebral white matter similar to the prior examination.   Electronically Signed   By: Trudie Reed M.D.   On: 08/05/2013 11:36   Mr Shirlee Latch Wo Contrast  08/24/2013   CLINICAL DATA:  53 year old male with altered mental status, decreased responsiveness and abnormal speech. Initial encounter.  EXAM: MRI HEAD WITHOUT CONTRAST  MRA HEAD WITHOUT CONTRAST  TECHNIQUE: Multiplanar, multiecho pulse sequences of the brain and surrounding structures were obtained without intravenous contrast. Angiographic images of the head were obtained using MRA technique without contrast.  COMPARISON:  Numerous Head CTs 08/23/2013 and earlier. Brain MRI and MRA 05/07/2005.  FINDINGS: MRI HEAD FINDINGS  Cerebral volume has mildly diminished since 2007. Major intracranial vascular flow voids are stable. No restricted diffusion to suggest acute infarction. No midline shift, mass effect, evidence of mass lesion, ventriculomegaly, extra-axial collection or acute intracranial hemorrhage. Cervicomedullary junction and pituitary are within normal limits. Stable visualized cervical spine. Normal bone marrow signal.  Scattered nonspecific cerebral white matter T2 and FLAIR hyperintensity is advanced for age, chronic, and has increased since 2007. No associated cortical encephalomalacia is identified. Deep gray matter nuclei remain normal. Brainstem and cerebellum remain normal. No chronic blood products identified in the brain.  Visible internal auditory structures appear normal. Mastoids are clear. Moderate paranasal sinus mucosal thickening, mildly progressed compared to 2007. Visualized orbit soft tissues are within normal limits.  Visualized scalp soft tissues are within normal limits.  MRA HEAD FINDINGS  Antegrade flow in the posterior circulation. Codominant distal vertebral arteries. Normal left PICA origin. Dominant right AICA. Normal vertebrobasilar junction. No basilar artery stenosis. Normal SCA and PCA origins. Posterior communicating arteries are diminutive or absent. Bilateral PCA branches are within normal limits.  Stable antegrade flow in both ICA siphons. Chronically tortuous cavernous ICA segments. No ICA siphon stenosis. Ophthalmic artery origins are within normal limits. Stable and normal carotid termini, MCA and ACA origins.  Azygos type ACA anatomy with what appears to be a superimposed median artery of the corpus callosum. Visualized bilateral ACA branches are stable. Visualized bilateral MCA branches are stable and within normal limits.  IMPRESSION: 1. No acute intracranial abnormality. Progressed but nonspecific age advanced cerebral white matter signal changes. 2. Stable and negative intracranial MRA; normal anatomic variation of the ACA anatomy.   Electronically Signed   By: Augusto Gamble M.D.   On: 08/24/2013 12:14   Mri Brain Without Contrast  08/24/2013   CLINICAL DATA:  53 year old male with altered mental status, decreased responsiveness and abnormal speech. Initial encounter.  EXAM: MRI HEAD WITHOUT CONTRAST  MRA HEAD WITHOUT CONTRAST  TECHNIQUE: Multiplanar, multiecho pulse sequences of the brain and surrounding structures were obtained without intravenous contrast. Angiographic images of the head were obtained using MRA technique without contrast.  COMPARISON:  Numerous Head CTs 08/23/2013 and earlier. Brain MRI and MRA 05/07/2005.  FINDINGS: MRI HEAD FINDINGS  Cerebral volume has mildly diminished since 2007. Major intracranial vascular flow voids are stable. No restricted diffusion to suggest acute infarction. No midline shift,  mass effect, evidence of mass lesion, ventriculomegaly, extra-axial collection or  acute intracranial hemorrhage. Cervicomedullary junction and pituitary are within normal limits. Stable visualized cervical spine. Normal bone marrow signal.  Scattered nonspecific cerebral white matter T2 and FLAIR hyperintensity is advanced for age, chronic, and has increased since 2007. No associated cortical encephalomalacia is identified. Deep gray matter nuclei remain normal. Brainstem and cerebellum remain normal. No chronic blood products identified in the brain.  Visible internal auditory structures appear normal. Mastoids are clear. Moderate paranasal sinus mucosal thickening, mildly progressed compared to 2007. Visualized orbit soft tissues are within normal limits. Visualized scalp soft tissues are within normal limits.  MRA HEAD FINDINGS  Antegrade flow in the posterior circulation. Codominant distal vertebral arteries. Normal left PICA origin. Dominant right AICA. Normal vertebrobasilar junction. No basilar artery stenosis. Normal SCA and PCA origins. Posterior communicating arteries are diminutive or absent. Bilateral PCA branches are within normal limits.  Stable antegrade flow in both ICA siphons. Chronically tortuous cavernous ICA segments. No ICA siphon stenosis. Ophthalmic artery origins are within normal limits. Stable and normal carotid termini, MCA and ACA origins.  Azygos type ACA anatomy with what appears to be a superimposed median artery of the corpus callosum. Visualized bilateral ACA branches are stable. Visualized bilateral MCA branches are stable and within normal limits.  IMPRESSION: 1. No acute intracranial abnormality. Progressed but nonspecific age advanced cerebral white matter signal changes. 2. Stable and negative intracranial MRA; normal anatomic variation of the ACA anatomy.   Electronically Signed   By: Augusto GambleLee  Hall M.D.   On: 08/24/2013 12:14   [place] Carotid Bilateral  08/16/2013   CLINICAL DATA:  Mini strokes  EXAM: BILATERAL CAROTID DUPLEX ULTRASOUND  TECHNIQUE: Wallace CullensGray scale  imaging, color Doppler and duplex ultrasound were performed of bilateral carotid and vertebral arteries in the neck.  COMPARISON:  None.  FINDINGS: Criteria: Quantification of carotid stenosis is based on velocity parameters that correlate the residual internal carotid diameter with NASCET-based stenosis levels, using the diameter of the distal internal carotid lumen as the denominator for stenosis measurement.  The following velocity measurements were obtained:  RIGHT  ICA:  76/22 cm/sec  CCA:  86/33 cm/sec  SYSTOLIC ICA/CCA RATIO:  0.88  DIASTOLIC ICA/CCA RATIO:  0.65  ECA:  80 cm/sec  LEFT  ICA:  71/31 cm/sec  CCA:  72/27 cm/sec  SYSTOLIC ICA/CCA RATIO:  0.99  DIASTOLIC ICA/CCA RATIO:  1.13  ECA:  80 cm/sec  RIGHT CAROTID ARTERY: Mild atherosclerotic plaque is noted. The waveforms, velocities and flow velocity ratios however demonstrate no evidence of focal hemodynamically significant stenosis.  RIGHT VERTEBRAL ARTERY:  Antegrade in nature.  LEFT CAROTID ARTERY: Mild atherosclerotic plaque is noted. The waveforms, velocities and flow velocity ratios however demonstrate no evidence of focal hemodynamically significant stenosis.  LEFT VERTEBRAL ARTERY:  Antegrade in nature.  IMPRESSION: Bilateral plaque formation without focal hemodynamically significant stenosis.   Electronically Signed   By: Alcide CleverMark  Lukens M.D.   On: 08/16/2013 15:01   Dg Abd Acute W/chest  08/05/2013   CLINICAL DATA:  Left upper quadrant abdominal pain. Abdominal distention.  EXAM: ACUTE ABDOMEN SERIES (ABDOMEN 2 VIEW & CHEST 1 VIEW)  COMPARISON:  Chest x-ray 05/08/2012.  FINDINGS: Linear opacities in the lower left hemithorax may represent areas of subsegmental atelectasis or scar, but are new compared to the remote prior examination. Irregular contour of the lower right heart border is favored to be related to pectus excavatum and small amount of epicardial fat (better  demonstrated on prior CT of the abdomen 03/16/2012). No acute  consolidative airspace disease. No pleural effusions. No evidence of pulmonary edema. No pneumothorax. Heart size is within normal limits. Upper mediastinal contours are unremarkable.  Gas and stool are seen scattered throughout the colon extending to the level of the distal rectum. No pathologic distension of small bowel is noted. No gross evidence of pneumoperitoneum.  IMPRESSION: 1.  Nonobstructive bowel gas pattern. 2. No pneumoperitoneum. 3. New linear opacities in the lower left hemithorax may reflect areas of atelectasis and/or scarring in the lingula or left lower lobe.   Electronically Signed   By: Trudie Reed M.D.   On: 08/05/2013 11:34    Lab Results: Basic Metabolic Panel:  Recent Labs  16/10/96 2103 08/23/13 2130 08/25/13 0615  NA 138 140 141  K 4.1 3.9 3.8  CL 99 97 103  CO2 29  --  28  GLUCOSE 73 70 92  BUN 8 6 7   CREATININE 1.50* 1.60* 1.30  CALCIUM 9.0  --  8.4   Liver Function Tests:  Recent Labs  08/23/13 2103 08/25/13 0615  AST 18 15  ALT 9 7  ALKPHOS 82 69  BILITOT 0.4 0.3  PROT 6.8 6.3  ALBUMIN 3.9 3.5     CBC:  Recent Labs  08/23/13 2103 08/23/13 2130 08/25/13 0615  WBC 6.2  --  6.0  NEUTROABS 3.3  --   --   HGB 13.8 15.0 12.5*  HCT 42.2 44.0 38.3*  MCV 90.8  --  90.5  PLT 202  --  176    No results found for this or any previous visit (from the past 240 hour(s)).   Hospital Course: This is a 53 year old man who presented to the hospital with symptoms of altered mental status with history as outlined below: This is a 53 year old caucasian male with a history of anxiety, GERD, DDD, who presents to the hospital with altered mental status and decreased responsiveness. He was brought to the ED by his son's girlfriend, who gives part of the history. The patient has had episodes of aphasia and weakness that started about 2-3 weeks ago and has been evaluated by his primary care physician. These episodes last for a couple of hours, then resolve  and can occur several times a day. Tonight, he called his son's girlfriend and told her that he didn't feel well and wanted her to take him to the hospital. When she got to his house to pick him up, he was minimally responsive and could not verbalize. As part of the work up by his PCP, he has had a CT of his head, which was absent of any acute process. Additionally, he has had a carotid ultrasound, which revealed mild plaque bilaterally. He was scheduled for an MRI tomorrow morning.  He was admitted and his symptoms improved fairly rapidly. Investigations with MRI brain scan was unremarkable. He was seen by neurology, Dr. Gerilyn Pilgrim, who has ordered an EEG and this is still pending. He had some nonspecific chest pain but this resolved and was not investigated. He feels back to his normal self now except for somewhat weak. He will followup in the outpatient setting with the results of the EEG with neurology. He will follow with his primary care physician next couple weeks also. Discharge Exam: Blood pressure 94/61, pulse 64, temperature 98.3 F (36.8 C), temperature source Oral, resp. rate 16, height 5' 6.5" (1.689 m), weight 91.1 kg (200 lb 13.4 oz), SpO2 98.00%.  He looks systemically well. He is alert and orientated. There is no speech abnormality. There are no focal neurological signs. Lung fields are clear. Heart sounds are present without gallop rhythm.  Disposition: Home.      Discharge Instructions   Diet - low sodium heart healthy    Complete by:  As directed      Increase activity slowly    Complete by:  As directed            Follow-up Information   Follow up with [facility], KOFI, MD. Schedule an appointment as soon as possible for a visit in 1 week. (To followup on EEG and symptoms.)    Specialty:  Neurology   Contact information:   [address] [place] [address] [phone]       Follow up with Milana Obey, MD In 2 weeks.   Specialty:  Family Medicine    Contact information:   [address] [place] [address] [phone]       Signed: Wilson Singer   08/25/2013, 7:12 AM

## 2013-08-25 NOTE — Progress Notes (Signed)
EEG Completed; Results Pending  

## 2013-08-25 NOTE — Progress Notes (Signed)
Discharged home,with instructions given on follow up appointments,and medication,patient verbalized understanding.Prescription sent with patient.No c/o pain or discomfort noted.Accompanied by staff to awaiting vehicle.

## 2013-08-28 LAB — GLUCOSE, CAPILLARY
GLUCOSE-CAPILLARY: 124 mg/dL — AB (ref 70–99)
Glucose-Capillary: 101 mg/dL — ABNORMAL HIGH (ref 70–99)
Glucose-Capillary: 105 mg/dL — ABNORMAL HIGH (ref 70–99)

## 2013-08-28 NOTE — Procedures (Signed)
  [facility] Lexee Brashears A. Gerilyn Pilgrimoonquah, MD     [email or web address]           HISTORY: This is a 53 year old man who presents with altered mental status. This test doesn't evaluate for seizure activity.  MEDICATIONS: Scheduled Meds: Continuous Infusions: PRN Meds:.    Prior to Admission medications   Medication Sig Start Date End Date Taking? Authorizing Provider  alprazolam Prudy Feeler(XANAX) 2 MG tablet Take 2 mg by mouth 4 (four) times daily.      Historical Provider, MD  aspirin 325 MG tablet Take 1 tablet (325 mg total) by mouth daily. 08/25/13   Nimish Normajean Glasgow Gosrani, MD  dicyclomine (BENTYL) 10 MG capsule Take 20 mg by mouth 2 (two) times daily.    Historical Provider, MD  doxepin (SINEQUAN) 100 MG capsule Take 100 mg by mouth at bedtime.    Historical Provider, MD  HYDROcodone-acetaminophen (NORCO) 10-325 MG per tablet Take 1-2 tablets by mouth every 6 (six) hours as needed. For pain     Historical Provider, MD  pantoprazole (PROTONIX) 40 MG tablet Take 40 mg by mouth daily.    Historical Provider, MD  predniSONE (DELTASONE) 5 MG tablet Take 5 mg by mouth daily.    Historical Provider, MD      ANALYSIS: A 16 channel recording using standard 10 20 measurements is conducted for approxemately 20 minutes.  There is a well-formed posterior dominant rhythm of 12 Hz and attenuates with eye opening. There is beta activity observed in the frontal areas. Awake and drowsy activities are recorded. Photic summation and hyperventilation were not carried out. They are no focal or lateralized slowing. There are no epileptiform activity observed.   IMPRESSION: 1. This is a normal recording awake and drowsy states.      Stevens Magwood A. Gerilyn Pilgrimoonquah, M.D.  Diplomate, Biomedical engineerAmerican Board of Psychiatry and Neurology ( Neurology).

## 2013-12-28 ENCOUNTER — Emergency Department (HOSPITAL_COMMUNITY)
Admission: EM | Admit: 2013-12-28 | Discharge: 2013-12-28 | Disposition: A | Discharge status: Home / Self Care | Payer: Medicaid Other | Attending: Emergency Medicine | Admitting: Emergency Medicine

## 2013-12-28 ENCOUNTER — Encounter (HOSPITAL_COMMUNITY): Payer: Self-pay | Admitting: Emergency Medicine

## 2013-12-28 DIAGNOSIS — Z7982 Long term (current) use of aspirin: Secondary | ICD-10-CM | POA: Insufficient documentation

## 2013-12-28 DIAGNOSIS — Z9089 Acquired absence of other organs: Secondary | ICD-10-CM | POA: Diagnosis not present

## 2013-12-28 DIAGNOSIS — Z87891 Personal history of nicotine dependence: Secondary | ICD-10-CM | POA: Insufficient documentation

## 2013-12-28 DIAGNOSIS — Z880 Allergy status to penicillin: Secondary | ICD-10-CM | POA: Insufficient documentation

## 2013-12-28 DIAGNOSIS — Z79899 Other long term (current) drug therapy: Secondary | ICD-10-CM | POA: Diagnosis not present

## 2013-12-28 DIAGNOSIS — R1084 Generalized abdominal pain: Secondary | ICD-10-CM | POA: Insufficient documentation

## 2013-12-28 DIAGNOSIS — Z8739 Personal history of other diseases of the musculoskeletal system and connective tissue: Secondary | ICD-10-CM | POA: Insufficient documentation

## 2013-12-28 DIAGNOSIS — K219 Gastro-esophageal reflux disease without esophagitis: Secondary | ICD-10-CM | POA: Insufficient documentation

## 2013-12-28 DIAGNOSIS — F419 Anxiety disorder, unspecified: Secondary | ICD-10-CM | POA: Insufficient documentation

## 2013-12-28 LAB — CBC WITH DIFFERENTIAL/PLATELET
BASOS ABS: 0 10*3/uL (ref 0.0–0.1)
BASOS PCT: 1 % (ref 0–1)
EOS PCT: 5 % (ref 0–5)
Eosinophils Absolute: 0.3 10*3/uL (ref 0.0–0.7)
HEMATOCRIT: 46 % (ref 39.0–52.0)
Hemoglobin: 14.9 g/dL (ref 13.0–17.0)
LYMPHS PCT: 23 % (ref 12–46)
Lymphs Abs: 1.2 10*3/uL (ref 0.7–4.0)
MCH: 28.3 pg (ref 26.0–34.0)
MCHC: 32.4 g/dL (ref 30.0–36.0)
MCV: 87.3 fL (ref 78.0–100.0)
Monocytes Absolute: 0.5 10*3/uL (ref 0.1–1.0)
Monocytes Relative: 10 % (ref 3–12)
Neutro Abs: 3.1 10*3/uL (ref 1.7–7.7)
Neutrophils Relative %: 61 % (ref 43–77)
PLATELETS: 179 10*3/uL (ref 150–400)
RBC: 5.27 MIL/uL (ref 4.22–5.81)
RDW: 14.6 % (ref 11.5–15.5)
WBC: 5 10*3/uL (ref 4.0–10.5)

## 2013-12-28 LAB — COMPREHENSIVE METABOLIC PANEL WITH GFR
ALT: 24 U/L (ref 0–53)
AST: 31 U/L (ref 0–37)
Albumin: 4.2 g/dL (ref 3.5–5.2)
Alkaline Phosphatase: 92 U/L (ref 39–117)
Anion gap: 10 (ref 5–15)
BUN: 7 mg/dL (ref 6–23)
CO2: 26 meq/L (ref 19–32)
Calcium: 9.4 mg/dL (ref 8.4–10.5)
Chloride: 101 meq/L (ref 96–112)
Creatinine, Ser: 1.12 mg/dL (ref 0.50–1.35)
GFR calc Af Amer: 84 mL/min — ABNORMAL LOW
GFR calc non Af Amer: 73 mL/min — ABNORMAL LOW
Glucose, Bld: 91 mg/dL (ref 70–99)
Potassium: 4.7 meq/L (ref 3.7–5.3)
Sodium: 137 meq/L (ref 137–147)
Total Bilirubin: 0.7 mg/dL (ref 0.3–1.2)
Total Protein: 7.3 g/dL (ref 6.0–8.3)

## 2013-12-28 LAB — LIPASE, BLOOD: Lipase: 36 U/L (ref 11–59)

## 2013-12-28 LAB — TROPONIN I

## 2013-12-28 MED ORDER — OXYCODONE-ACETAMINOPHEN 5-325 MG PO TABS: ORAL_TABLET | ORAL | Status: DC

## 2013-12-28 MED ORDER — ONDANSETRON 4 MG PO TBDP
4.0000 mg | ORAL_TABLET | Freq: Once | ORAL | Status: AC
  Administered 2013-12-28: 4 mg via ORAL
  Filled 2013-12-28: qty 1

## 2013-12-28 MED ORDER — ASPIRIN 81 MG PO CHEW
324.0000 mg | CHEWABLE_TABLET | Freq: Once | ORAL | Status: AC
  Administered 2013-12-28: 324 mg via ORAL
  Filled 2013-12-28: qty 4

## 2013-12-28 MED ORDER — OXYCODONE-ACETAMINOPHEN 5-325 MG PO TABS
1.0000 | ORAL_TABLET | Freq: Once | ORAL | Status: AC
  Administered 2013-12-28: 1 via ORAL
  Filled 2013-12-28: qty 1
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
## 2013-12-28 NOTE — Discharge Instructions (Signed)
Take percocet for breakthrough pain, do not drink alcohol, drive, care for children or do other critical tasks while taking percocet. ° °Please follow with your primary care doctor in the next 2 days for a check-up. They must obtain records for further management.  ° °Do not hesitate to return to the Emergency Department for any new, worsening or concerning symptoms.  ° ° °Abdominal Pain °Many things can cause abdominal pain. Usually, abdominal pain is not caused by a disease and will improve without treatment. It can often be observed and treated at home. Your health care provider will do a physical exam and possibly order blood tests and X-rays to help determine the seriousness of your pain. However, in many cases, more time must pass before a clear cause of the pain can be found. Before that point, your health care provider may not know if you need more testing or further treatment. °HOME CARE INSTRUCTIONS  °Monitor your abdominal pain for any changes. The following actions may help to alleviate any discomfort you are experiencing: °· Only take over-the-counter or prescription medicines as directed by your health care provider. °· Do not take laxatives unless directed to do so by your health care provider. °· Try a clear liquid diet (broth, tea, or water) as directed by your health care provider. Slowly move to a bland diet as tolerated. °SEEK MEDICAL CARE IF: °· You have unexplained abdominal pain. °· You have abdominal pain associated with nausea or diarrhea. °· You have pain when you urinate or have a bowel movement. °· You experience abdominal pain that wakes you in the night. °· You have abdominal pain that is worsened or improved by eating food. °· You have abdominal pain that is worsened with eating fatty foods. °· You have a fever. °SEEK IMMEDIATE MEDICAL CARE IF:  °· Your pain does not go away within 2 hours. °· You keep throwing up (vomiting). °· Your pain is felt only in portions of the abdomen, such  as the right side or the left lower portion of the abdomen. °· You pass bloody or black tarry stools. °MAKE SURE YOU: °· Understand these instructions.   °· Will watch your condition.   °· Will get help right away if you are not doing well or get worse.   °Document Released: 10/08/2004 Document Revised: 01/03/2013 Document Reviewed: 09/07/2012 °[employer]® Patient Information ©2015 [employer]. This information is not intended to replace advice given to you by your health care provider. Make sure you discuss any questions you have with your health care provider. ° °

## 2013-12-28 NOTE — ED Notes (Signed)
Pt given ice water.

## 2013-12-28 NOTE — ED Notes (Signed)
Reports abd pain (chronic), nausea, no vomiting, no diarrhea, A/O X4, ambulatory and in NAD

## 2014-01-15 ENCOUNTER — Ambulatory Visit (HOSPITAL_COMMUNITY)
Admission: RE | Admit: 2014-01-15 | Discharge: 2014-01-15 | Disposition: A | Discharge status: Home / Self Care | Payer: Medicaid Other | Source: Ambulatory Visit | Attending: Family Medicine | Admitting: Family Medicine

## 2014-01-15 ENCOUNTER — Other Ambulatory Visit (HOSPITAL_COMMUNITY): Payer: Self-pay | Admitting: Family Medicine

## 2014-01-15 DIAGNOSIS — M5032 Other cervical disc degeneration, mid-cervical region: Secondary | ICD-10-CM | POA: Diagnosis not present

## 2014-01-15 DIAGNOSIS — M542 Cervicalgia: Secondary | ICD-10-CM

## 2014-01-15 DIAGNOSIS — M4602 Spinal enthesopathy, cervical region: Secondary | ICD-10-CM | POA: Diagnosis not present

## 2014-01-31 ENCOUNTER — Emergency Department (HOSPITAL_COMMUNITY): Payer: Medicaid Other

## 2014-01-31 ENCOUNTER — Encounter (HOSPITAL_COMMUNITY): Payer: Self-pay | Admitting: Emergency Medicine

## 2014-01-31 ENCOUNTER — Emergency Department (HOSPITAL_COMMUNITY)
Admission: EM | Admit: 2014-01-31 | Discharge: 2014-01-31 | Disposition: A | Discharge status: Home / Self Care | Payer: Medicaid Other | Attending: Emergency Medicine | Admitting: Emergency Medicine

## 2014-01-31 DIAGNOSIS — R531 Weakness: Secondary | ICD-10-CM | POA: Diagnosis not present

## 2014-01-31 DIAGNOSIS — R200 Anesthesia of skin: Secondary | ICD-10-CM | POA: Diagnosis not present

## 2014-01-31 DIAGNOSIS — Z8719 Personal history of other diseases of the digestive system: Secondary | ICD-10-CM | POA: Insufficient documentation

## 2014-01-31 DIAGNOSIS — M5412 Radiculopathy, cervical region: Secondary | ICD-10-CM | POA: Diagnosis not present

## 2014-01-31 DIAGNOSIS — Z7982 Long term (current) use of aspirin: Secondary | ICD-10-CM | POA: Insufficient documentation

## 2014-01-31 DIAGNOSIS — R42 Dizziness and giddiness: Secondary | ICD-10-CM

## 2014-01-31 DIAGNOSIS — Z87891 Personal history of nicotine dependence: Secondary | ICD-10-CM | POA: Insufficient documentation

## 2014-01-31 DIAGNOSIS — F419 Anxiety disorder, unspecified: Secondary | ICD-10-CM | POA: Diagnosis not present

## 2014-01-31 DIAGNOSIS — Z79899 Other long term (current) drug therapy: Secondary | ICD-10-CM | POA: Diagnosis not present

## 2014-01-31 DIAGNOSIS — M542 Cervicalgia: Secondary | ICD-10-CM | POA: Diagnosis present

## 2014-01-31 DIAGNOSIS — M1990 Unspecified osteoarthritis, unspecified site: Secondary | ICD-10-CM | POA: Insufficient documentation

## 2014-01-31 DIAGNOSIS — Z880 Allergy status to penicillin: Secondary | ICD-10-CM | POA: Diagnosis not present

## 2014-01-31 MED ORDER — METHYLPREDNISOLONE 4 MG PO KIT: PACK | ORAL | Status: DC

## 2014-01-31 MED ORDER — MECLIZINE HCL 12.5 MG PO TABS
25.0000 mg | ORAL_TABLET | Freq: Once | ORAL | Status: AC
  Administered 2014-01-31: 25 mg via ORAL
  Filled 2014-01-31: qty 2

## 2014-01-31 MED ORDER — MECLIZINE HCL 25 MG PO TABS: 25.0000 mg | ORAL_TABLET | Freq: Three times a day (TID) | ORAL | Status: DC | PRN

## 2014-01-31 NOTE — ED Notes (Signed)
EDP at bedside  

## 2014-01-31 NOTE — ED Notes (Signed)
Pt reporting pain to the left side of neck since yesterday. States that when he turns his head a certain way he gets dizzy and it makes "my left arm weak".

## 2014-01-31 NOTE — ED Notes (Signed)
Pt. C/o numbness/ tingling to left arm. Pt. Reports episode of left sided weakness with left facial droop yesterday. No weakness at present.

## 2014-01-31 NOTE — Discharge Instructions (Signed)
Follow-up with your doctor to discuss outpatient MRI. Return to ER if you develop any right upper extremity symptoms, or any symptoms with your legs including numbness weakness tingling.  Cervical Radiculopathy Cervical radiculopathy happens when a nerve in the neck is pinched or bruised by a slipped (herniated) disk or by arthritic changes in the bones of the cervical spine. This can occur due to an injury or as part of the normal aging process. Pressure on the cervical nerves can cause pain or numbness that runs from your neck all the way down into your arm and fingers. CAUSES  There are many possible causes, including:  Injury.  Muscle tightness in the neck from overuse.  Swollen, painful joints (arthritis).  Breakdown or degeneration in the bones and joints of the spine (spondylosis) due to aging.  Bone spurs that may develop near the cervical nerves. SYMPTOMS  Symptoms include pain, weakness, or numbness in the affected arm and hand. Pain can be severe or irritating. Symptoms may be worse when extending or turning the neck. DIAGNOSIS  Your caregiver will ask about your symptoms and do a physical exam. He or she may test your strength and reflexes. X-rays, CT scans, and MRI scans may be needed in cases of injury or if the symptoms do not go away after a period of time. Electromyography (EMG) or nerve conduction testing may be done to study how your nerves and muscles are working. TREATMENT  Your caregiver may recommend certain exercises to help relieve your symptoms. Cervical radiculopathy can, and often does, get better with time and treatment. If your problems continue, treatment options may include:  Wearing a soft collar for short periods of time.  Physical therapy to strengthen the neck muscles.  Medicines, such as nonsteroidal anti-inflammatory drugs (NSAIDs), oral corticosteroids, or spinal injections.  Surgery. Different types of surgery may be done depending on the cause of  your problems. HOME CARE INSTRUCTIONS   Put ice on the affected area.  Put ice in a plastic bag.  Place a towel between your skin and the bag.  Leave the ice on for 15-20 minutes, 03-04 times a day or as directed by your caregiver.  If ice does not help, you can try using heat. Take a warm shower or bath, or use a hot water bottle as directed by your caregiver.  You may try a gentle neck and shoulder massage.  Use a flat pillow when you sleep.  Only take over-the-counter or prescription medicines for pain, discomfort, or fever as directed by your caregiver.  If physical therapy was prescribed, follow your caregiver's directions.  If a soft collar was prescribed, use it as directed. SEEK IMMEDIATE MEDICAL CARE IF:   Your pain gets much worse and cannot be controlled with medicines.  You have weakness or numbness in your hand, arm, face, or leg.  You have a high fever or a stiff, rigid neck.  You lose bowel or bladder control (incontinence).  You have trouble with walking, balance, or speaking. MAKE SURE YOU:   Understand these instructions.  Will watch your condition.  Will get help right away if you are not doing well or get worse. Document Released: 09/23/2000 Document Revised: 03/23/2011 Document Reviewed: 08/12/2010 [facility] Patient Information 2015 [place], [place]. This information is not intended to replace advice given to you by your health care provider. Make sure you discuss any questions you have with your health care provider.  Benign Positional Vertigo Vertigo means you feel like you or your  surroundings are moving when they are not. Benign positional vertigo is the most common form of vertigo. Benign means that the cause of your condition is not serious. Benign positional vertigo is more common in older adults. CAUSES  Benign positional vertigo is the result of an upset in the labyrinth system. This is an area in the middle ear that helps control your  balance. This may be caused by a viral infection, head injury, or repetitive motion. However, often no specific cause is found. SYMPTOMS  Symptoms of benign positional vertigo occur when you move your head or eyes in different directions. Some of the symptoms may include:  Loss of balance and falls.  Vomiting.  Blurred vision.  Dizziness.  Nausea.  Involuntary eye movements (nystagmus). DIAGNOSIS  Benign positional vertigo is usually diagnosed by physical exam. If the specific cause of your benign positional vertigo is unknown, your caregiver may perform imaging tests, such as magnetic resonance imaging (MRI) or computed tomography (CT). TREATMENT  Your caregiver may recommend movements or procedures to correct the benign positional vertigo. Medicines such as meclizine, benzodiazepines, and medicines for nausea may be used to treat your symptoms. In rare cases, if your symptoms are caused by certain conditions that affect the inner ear, you may need surgery. HOME CARE INSTRUCTIONS   Follow your caregiver's instructions.  Move slowly. Do not make sudden body or head movements.  Avoid driving.  Avoid operating heavy machinery.  Avoid performing any tasks that would be dangerous to you or others during a vertigo episode.  Drink enough fluids to keep your urine clear or pale yellow. SEEK IMMEDIATE MEDICAL CARE IF:   You develop problems with walking, weakness, numbness, or using your arms, hands, or legs.  You have difficulty speaking.  You develop severe headaches.  Your nausea or vomiting continues or gets worse.  You develop visual changes.  Your family or friends notice any behavioral changes.  Your condition gets worse.  You have a fever.  You develop a stiff neck or sensitivity to light. MAKE SURE YOU:   Understand these instructions.  Will watch your condition.  Will get help right away if you are not doing well or get worse. Document Released: 10/06/2005  Document Revised: 03/23/2011 Document Reviewed: 09/18/2010 [facility] Patient Information 2015 [place], [place]. This information is not intended to replace advice given to you by your health care provider. Make sure you discuss any questions you have with your health care provider.

## 2014-01-31 NOTE — ED Notes (Signed)
Pt also reports numbness going down his arm.
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
## 2014-02-08 ENCOUNTER — Other Ambulatory Visit: Payer: Self-pay | Admitting: Family Medicine

## 2014-02-08 DIAGNOSIS — M542 Cervicalgia: Secondary | ICD-10-CM

## 2014-02-12 ENCOUNTER — Ambulatory Visit
Admission: RE | Admit: 2014-02-12 | Discharge: 2014-02-12 | Disposition: A | Discharge status: Home / Self Care | Payer: Medicaid Other | Source: Ambulatory Visit | Attending: Family Medicine | Admitting: Family Medicine

## 2014-02-12 DIAGNOSIS — M542 Cervicalgia: Secondary | ICD-10-CM

## 2014-02-12 MED ORDER — TRIAMCINOLONE ACETONIDE 40 MG/ML IJ SUSP (RADIOLOGY)
60.0000 mg | Freq: Once | INTRAMUSCULAR | Status: AC
  Administered 2014-02-12: 60 mg via EPIDURAL

## 2014-02-12 MED ORDER — IOHEXOL 300 MG/ML  SOLN
1.0000 mL | Freq: Once | INTRAMUSCULAR | Status: AC | PRN
  Administered 2014-02-12: 1 mL via EPIDURAL

## 2014-02-12 NOTE — Discharge Instructions (Signed)

## 2014-02-28 ENCOUNTER — Other Ambulatory Visit: Payer: Self-pay | Admitting: Family Medicine

## 2014-02-28 DIAGNOSIS — M542 Cervicalgia: Secondary | ICD-10-CM

## 2014-03-01 ENCOUNTER — Ambulatory Visit
Admission: RE | Admit: 2014-03-01 | Discharge: 2014-03-01 | Disposition: A | Discharge status: Home / Self Care | Payer: Medicaid Other | Source: Ambulatory Visit | Attending: Family Medicine | Admitting: Family Medicine

## 2014-03-01 DIAGNOSIS — M542 Cervicalgia: Secondary | ICD-10-CM

## 2014-03-01 MED ORDER — TRIAMCINOLONE ACETONIDE 40 MG/ML IJ SUSP (RADIOLOGY)
40.0000 mg | Freq: Once | INTRAMUSCULAR | Status: AC
  Administered 2014-03-01: 40 mg via EPIDURAL

## 2014-03-01 MED ORDER — IOHEXOL 300 MG/ML  SOLN
1.0000 mL | Freq: Once | INTRAMUSCULAR | Status: AC | PRN
  Administered 2014-03-01: 1 mL via EPIDURAL

## 2014-03-05 ENCOUNTER — Encounter (HOSPITAL_COMMUNITY): Payer: Self-pay | Admitting: Emergency Medicine

## 2014-03-05 ENCOUNTER — Emergency Department (HOSPITAL_COMMUNITY)
Admission: EM | Admit: 2014-03-05 | Discharge: 2014-03-05 | Disposition: A | Discharge status: Home / Self Care | Payer: Medicaid Other | Attending: Emergency Medicine | Admitting: Emergency Medicine

## 2014-03-05 DIAGNOSIS — R51 Headache: Secondary | ICD-10-CM | POA: Insufficient documentation

## 2014-03-05 DIAGNOSIS — Z7982 Long term (current) use of aspirin: Secondary | ICD-10-CM | POA: Diagnosis not present

## 2014-03-05 DIAGNOSIS — Z87891 Personal history of nicotine dependence: Secondary | ICD-10-CM | POA: Diagnosis not present

## 2014-03-05 DIAGNOSIS — Z880 Allergy status to penicillin: Secondary | ICD-10-CM | POA: Diagnosis not present

## 2014-03-05 DIAGNOSIS — Z7952 Long term (current) use of systemic steroids: Secondary | ICD-10-CM | POA: Insufficient documentation

## 2014-03-05 DIAGNOSIS — F419 Anxiety disorder, unspecified: Secondary | ICD-10-CM | POA: Diagnosis not present

## 2014-03-05 DIAGNOSIS — M1990 Unspecified osteoarthritis, unspecified site: Secondary | ICD-10-CM | POA: Insufficient documentation

## 2014-03-05 DIAGNOSIS — R519 Headache, unspecified: Secondary | ICD-10-CM

## 2014-03-05 DIAGNOSIS — Z8719 Personal history of other diseases of the digestive system: Secondary | ICD-10-CM | POA: Diagnosis not present

## 2014-03-05 MED ORDER — KETOROLAC TROMETHAMINE 30 MG/ML IJ SOLN
30.0000 mg | Freq: Once | INTRAMUSCULAR | Status: AC
  Administered 2014-03-05: 30 mg via INTRAVENOUS
  Filled 2014-03-05: qty 1

## 2014-03-05 MED ORDER — DEXAMETHASONE SODIUM PHOSPHATE 4 MG/ML IJ SOLN
10.0000 mg | Freq: Once | INTRAMUSCULAR | Status: AC
  Administered 2014-03-05: 10 mg via INTRAVENOUS
  Filled 2014-03-05: qty 3

## 2014-03-05 MED ORDER — DIPHENHYDRAMINE HCL 50 MG/ML IJ SOLN
25.0000 mg | Freq: Once | INTRAMUSCULAR | Status: AC
  Administered 2014-03-05: 25 mg via INTRAVENOUS
  Filled 2014-03-05: qty 1

## 2014-03-05 MED ORDER — MORPHINE SULFATE 4 MG/ML IJ SOLN
4.0000 mg | Freq: Once | INTRAMUSCULAR | Status: AC
  Administered 2014-03-05: 4 mg via INTRAVENOUS
  Filled 2014-03-05: qty 1

## 2014-03-05 MED ORDER — METOCLOPRAMIDE HCL 5 MG/ML IJ SOLN
10.0000 mg | Freq: Once | INTRAMUSCULAR | Status: AC
  Administered 2014-03-05: 10 mg via INTRAVENOUS
  Filled 2014-03-05: qty 2

## 2014-03-05 MED ORDER — TETRACAINE HCL 0.5 % OP SOLN
2.0000 [drp] | Freq: Once | OPHTHALMIC | Status: AC
  Administered 2014-03-05: 2 [drp] via OPHTHALMIC
  Filled 2014-03-05: qty 2

## 2014-03-05 NOTE — Discharge Instructions (Signed)
General Headache Without Cause Follow-up with Dr. Sudie BaileyKnowlton tomorrow as scheduled. Return to the ED if you develop new or worsening symptoms. A headache is pain or discomfort felt around the head or neck area. The specific cause of a headache may not be found. There are many causes and types of headaches. A few common ones are:  Tension headaches.  Migraine headaches.  Cluster headaches.  Chronic daily headaches. HOME CARE INSTRUCTIONS   Keep all follow-up appointments with your caregiver or any specialist referral.  Only take over-the-counter or prescription medicines for pain or discomfort as directed by your caregiver.  Lie down in a dark, quiet room when you have a headache.  Keep a headache journal to find out what may trigger your migraine headaches. For example, write down:  What you eat and drink.  How much sleep you get.  Any change to your diet or medicines.  Try massage or other relaxation techniques.  Put ice packs or heat on the head and neck. Use these 3 to 4 times per day for 15 to 20 minutes each time, or as needed.  Limit stress.  Sit up straight, and do not tense your muscles.  Quit smoking if you smoke.  Limit alcohol use.  Decrease the amount of caffeine you drink, or stop drinking caffeine.  Eat and sleep on a regular schedule.  Get 7 to 9 hours of sleep, or as recommended by your caregiver.  Keep lights dim if bright lights bother you and make your headaches worse. SEEK MEDICAL CARE IF:   You have problems with the medicines you were prescribed.  Your medicines are not working.  You have a change from the usual headache.  You have nausea or vomiting. SEEK IMMEDIATE MEDICAL CARE IF:   Your headache becomes severe.  You have a fever.  You have a stiff neck.  You have loss of vision.  You have muscular weakness or loss of muscle control.  You start losing your balance or have trouble walking.  You feel faint or pass out.  You  have severe symptoms that are different from your first symptoms. MAKE SURE YOU:   Understand these instructions.  Will watch your condition.  Will get help right away if you are not doing well or get worse. Document Released: 12/29/2004 Document Revised: 03/23/2011 Document Reviewed: 01/14/2011 [facility] Patient Information 2015 [employer] [place]. This information is not intended to replace advice given to you by your health care provider. Make sure you discuss any questions you have with your health care provider.

## 2014-03-05 NOTE — ED Notes (Signed)
Pt states that he has been receiving steroid injections in neck for bone spurs and had the last one on Thursday and his head has been hurting since starting the injections.
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
## 2014-05-03 ENCOUNTER — Other Ambulatory Visit (HOSPITAL_COMMUNITY): Payer: Self-pay | Admitting: Neurosurgery

## 2014-05-03 DIAGNOSIS — M4722 Other spondylosis with radiculopathy, cervical region: Secondary | ICD-10-CM

## 2014-05-10 ENCOUNTER — Ambulatory Visit (HOSPITAL_COMMUNITY)
Admission: RE | Admit: 2014-05-10 | Discharge: 2014-05-10 | Disposition: A | Discharge status: Home / Self Care | Payer: Medicaid Other | Source: Ambulatory Visit | Attending: Neurosurgery | Admitting: Neurosurgery

## 2014-05-10 DIAGNOSIS — M5012 Cervical disc disorder with radiculopathy, mid-cervical region: Secondary | ICD-10-CM | POA: Insufficient documentation

## 2014-05-10 DIAGNOSIS — M4722 Other spondylosis with radiculopathy, cervical region: Secondary | ICD-10-CM

## 2014-05-18 ENCOUNTER — Other Ambulatory Visit: Payer: Self-pay | Admitting: Neurosurgery

## 2014-05-25 ENCOUNTER — Encounter (HOSPITAL_COMMUNITY): Payer: Self-pay

## 2014-05-25 ENCOUNTER — Encounter (HOSPITAL_COMMUNITY)
Admission: RE | Admit: 2014-05-25 | Discharge: 2014-05-25 | Disposition: A | Discharge status: Home / Self Care | Payer: Medicaid Other | Source: Ambulatory Visit | Attending: Neurosurgery | Admitting: Neurosurgery

## 2014-05-25 HISTORY — DX: Reserved for inherently not codable concepts without codable children: IMO0001

## 2014-05-25 LAB — CBC
HCT: 45.4 % (ref 39.0–52.0)
Hemoglobin: 15.2 g/dL (ref 13.0–17.0)
MCH: 29.3 pg (ref 26.0–34.0)
MCHC: 33.5 g/dL (ref 30.0–36.0)
MCV: 87.5 fL (ref 78.0–100.0)
PLATELETS: 193 10*3/uL (ref 150–400)
RBC: 5.19 MIL/uL (ref 4.22–5.81)
RDW: 13.6 % (ref 11.5–15.5)
WBC: 7 10*3/uL (ref 4.0–10.5)

## 2014-05-25 LAB — SURGICAL PCR SCREEN
MRSA, PCR: NEGATIVE
Staphylococcus aureus: NEGATIVE

## 2014-05-25 NOTE — Pre-Procedure Instructions (Signed)
[name]  05/25/2014   Your procedure is scheduled on:  05-28-2014   Monday   [facility]   Call this number if you have problems the morning of surgery: [phone]   Remember:   Do not eat food or drink liquids after midnight.    Take these medicines the morning of surgery with A SIP OF WATER: Pain medication if needed,cyclobenazaprine(Flexeril),Alprazolam(xanax)     Do not wear jewelry  Do not wear lotions, powders, or perfumes.   Do not shave 48 hours prior to surgery. Men may shave face and neck.  Do not bring valuables to the hospital.  [facility]  for any belongings or valuables.               Contacts, dentures or bridgework may not be worn into surgery.  Leave suitcase in the car.    For patients admitted to the hospital, discharge time is determined by your  treatment team.               Patients discharged the day of surgery will not be allowed to drive home.      Special Instructions: See attached sheet for instructions on CHG  Shower/bath     Please read over the following fact sheets that you were given: Pain Booklet and Surgical Site Infection Prevention

## 2014-05-28 ENCOUNTER — Inpatient Hospital Stay (HOSPITAL_COMMUNITY): Payer: Medicaid Other | Admitting: Critical Care Medicine

## 2014-05-28 ENCOUNTER — Inpatient Hospital Stay (HOSPITAL_COMMUNITY)
Admission: RE | Admit: 2014-05-28 | Discharge: 2014-05-29 | DRG: 473 | Disposition: A | Discharge status: Home / Self Care | Payer: Medicaid Other | Source: Ambulatory Visit | Attending: Neurosurgery | Admitting: Neurosurgery

## 2014-05-28 ENCOUNTER — Encounter (HOSPITAL_COMMUNITY)
Admission: RE | Disposition: A | Discharge status: Home / Self Care | Payer: Self-pay | Source: Ambulatory Visit | Attending: Neurosurgery

## 2014-05-28 ENCOUNTER — Inpatient Hospital Stay (HOSPITAL_COMMUNITY): Payer: Medicaid Other

## 2014-05-28 ENCOUNTER — Encounter (HOSPITAL_COMMUNITY): Payer: Self-pay | Admitting: *Deleted

## 2014-05-28 DIAGNOSIS — M5012 Cervical disc disorder with radiculopathy, mid-cervical region: Principal | ICD-10-CM | POA: Diagnosis present

## 2014-05-28 DIAGNOSIS — Z7982 Long term (current) use of aspirin: Secondary | ICD-10-CM

## 2014-05-28 DIAGNOSIS — M4302 Spondylolysis, cervical region: Secondary | ICD-10-CM

## 2014-05-28 DIAGNOSIS — F411 Generalized anxiety disorder: Secondary | ICD-10-CM | POA: Diagnosis present

## 2014-05-28 DIAGNOSIS — Z87891 Personal history of nicotine dependence: Secondary | ICD-10-CM

## 2014-05-28 DIAGNOSIS — M4722 Other spondylosis with radiculopathy, cervical region: Secondary | ICD-10-CM | POA: Diagnosis present

## 2014-05-28 DIAGNOSIS — K589 Irritable bowel syndrome without diarrhea: Secondary | ICD-10-CM | POA: Diagnosis present

## 2014-05-28 DIAGNOSIS — M542 Cervicalgia: Secondary | ICD-10-CM | POA: Diagnosis present

## 2014-05-28 HISTORY — PX: ANTERIOR CERVICAL DECOMP/DISCECTOMY FUSION: SHX1161

## 2014-05-28 SURGERY — ANTERIOR CERVICAL DECOMPRESSION/DISCECTOMY FUSION 3 LEVELS
Anesthesia: General | Site: Spine Cervical

## 2014-05-28 MED ORDER — ARTIFICIAL TEARS OP OINT
TOPICAL_OINTMENT | OPHTHALMIC | Status: DC | PRN
  Administered 2014-05-28: 1 via OPHTHALMIC

## 2014-05-28 MED ORDER — GLYCOPYRROLATE 0.2 MG/ML IJ SOLN
INTRAMUSCULAR | Status: AC
  Filled 2014-05-28: qty 3

## 2014-05-28 MED ORDER — ROCURONIUM BROMIDE 50 MG/5ML IV SOLN
INTRAVENOUS | Status: AC
  Filled 2014-05-28: qty 1

## 2014-05-28 MED ORDER — ACETAMINOPHEN 650 MG RE SUPP: 650.0000 mg | RECTAL | Status: DC | PRN

## 2014-05-28 MED ORDER — PHENOL 1.4 % MT LIQD: 1.0000 | OROMUCOSAL | Status: DC | PRN

## 2014-05-28 MED ORDER — FENTANYL CITRATE (PF) 250 MCG/5ML IJ SOLN
INTRAMUSCULAR | Status: AC
  Filled 2014-05-28: qty 5

## 2014-05-28 MED ORDER — HYDROXYZINE HCL 25 MG PO TABS: 50.0000 mg | ORAL_TABLET | ORAL | Status: DC | PRN

## 2014-05-28 MED ORDER — GENTAMICIN IN SALINE 1.6-0.9 MG/ML-% IV SOLN
80.0000 mg | INTRAVENOUS | Status: AC
  Administered 2014-05-28: 80 mg via INTRAVENOUS
  Filled 2014-05-28: qty 50

## 2014-05-28 MED ORDER — KETOROLAC TROMETHAMINE 30 MG/ML IJ SOLN
30.0000 mg | Freq: Four times a day (QID) | INTRAMUSCULAR | Status: DC
  Administered 2014-05-28 – 2014-05-29 (×4): 30 mg via INTRAVENOUS
  Filled 2014-05-28 (×8): qty 1

## 2014-05-28 MED ORDER — FENTANYL CITRATE (PF) 100 MCG/2ML IJ SOLN
INTRAMUSCULAR | Status: DC | PRN
  Administered 2014-05-28 (×2): 50 ug via INTRAVENOUS
  Administered 2014-05-28: 100 ug via INTRAVENOUS
  Administered 2014-05-28 (×3): 50 ug via INTRAVENOUS

## 2014-05-28 MED ORDER — LIDOCAINE HCL 4 % MT SOLN
OROMUCOSAL | Status: DC | PRN
  Administered 2014-05-28: 4 mL via TOPICAL

## 2014-05-28 MED ORDER — EPHEDRINE SULFATE 50 MG/ML IJ SOLN
INTRAMUSCULAR | Status: AC
  Filled 2014-05-28: qty 1

## 2014-05-28 MED ORDER — DEXAMETHASONE SODIUM PHOSPHATE 4 MG/ML IJ SOLN
INTRAMUSCULAR | Status: DC | PRN
  Administered 2014-05-28: 8 mg via INTRAVENOUS

## 2014-05-28 MED ORDER — PROPOFOL 10 MG/ML IV BOLUS
INTRAVENOUS | Status: AC
  Filled 2014-05-28: qty 20

## 2014-05-28 MED ORDER — SODIUM CHLORIDE 0.9 % IV SOLN: 250.0000 mL | INTRAVENOUS | Status: DC

## 2014-05-28 MED ORDER — PROMETHAZINE HCL 25 MG/ML IJ SOLN: 6.2500 mg | INTRAMUSCULAR | Status: DC | PRN

## 2014-05-28 MED ORDER — OXYCODONE-ACETAMINOPHEN 5-325 MG PO TABS
1.0000 | ORAL_TABLET | ORAL | Status: DC | PRN
  Administered 2014-05-28 – 2014-05-29 (×5): 2 via ORAL
  Filled 2014-05-28 (×5): qty 2

## 2014-05-28 MED ORDER — VANCOMYCIN HCL IN DEXTROSE 1-5 GM/200ML-% IV SOLN
1000.0000 mg | INTRAVENOUS | Status: AC
  Administered 2014-05-28: 1000 mg via INTRAVENOUS
  Filled 2014-05-28: qty 200

## 2014-05-28 MED ORDER — SUCCINYLCHOLINE CHLORIDE 20 MG/ML IJ SOLN
INTRAMUSCULAR | Status: AC
  Filled 2014-05-28: qty 1

## 2014-05-28 MED ORDER — ARTIFICIAL TEARS OP OINT
TOPICAL_OINTMENT | OPHTHALMIC | Status: AC
  Filled 2014-05-28: qty 3.5

## 2014-05-28 MED ORDER — ZOLPIDEM TARTRATE 5 MG PO TABS: 10.0000 mg | ORAL_TABLET | Freq: Every evening | ORAL | Status: DC | PRN

## 2014-05-28 MED ORDER — LIDOCAINE HCL (CARDIAC) 20 MG/ML IV SOLN
INTRAVENOUS | Status: DC | PRN
  Administered 2014-05-28: 80 mg via INTRAVENOUS

## 2014-05-28 MED ORDER — ACETAMINOPHEN 325 MG PO TABS: 650.0000 mg | ORAL_TABLET | ORAL | Status: DC | PRN

## 2014-05-28 MED ORDER — ALUM & MAG HYDROXIDE-SIMETH 200-200-20 MG/5ML PO SUSP: 30.0000 mL | Freq: Four times a day (QID) | ORAL | Status: DC | PRN

## 2014-05-28 MED ORDER — LIDOCAINE HCL (CARDIAC) 20 MG/ML IV SOLN
INTRAVENOUS | Status: AC
  Filled 2014-05-28: qty 5

## 2014-05-28 MED ORDER — ROCURONIUM BROMIDE 100 MG/10ML IV SOLN
INTRAVENOUS | Status: DC | PRN
  Administered 2014-05-28: 10 mg via INTRAVENOUS
  Administered 2014-05-28: 50 mg via INTRAVENOUS
  Administered 2014-05-28 (×4): 10 mg via INTRAVENOUS

## 2014-05-28 MED ORDER — SODIUM CHLORIDE 0.9 % IJ SOLN
INTRAMUSCULAR | Status: AC
  Filled 2014-05-28: qty 10

## 2014-05-28 MED ORDER — 0.9 % SODIUM CHLORIDE (POUR BTL) OPTIME
TOPICAL | Status: DC | PRN
  Administered 2014-05-28: 1000 mL

## 2014-05-28 MED ORDER — SODIUM CHLORIDE 0.9 % IJ SOLN: 3.0000 mL | INTRAMUSCULAR | Status: DC | PRN

## 2014-05-28 MED ORDER — PROPOFOL 10 MG/ML IV BOLUS
INTRAVENOUS | Status: DC | PRN
  Administered 2014-05-28: 150 mg via INTRAVENOUS

## 2014-05-28 MED ORDER — KETOROLAC TROMETHAMINE 30 MG/ML IJ SOLN
30.0000 mg | Freq: Once | INTRAMUSCULAR | Status: AC
  Administered 2014-05-28: 30 mg via INTRAVENOUS

## 2014-05-28 MED ORDER — KETOROLAC TROMETHAMINE 30 MG/ML IJ SOLN
INTRAMUSCULAR | Status: AC
  Filled 2014-05-28: qty 1

## 2014-05-28 MED ORDER — GLYCOPYRROLATE 0.2 MG/ML IJ SOLN
INTRAMUSCULAR | Status: DC | PRN
  Administered 2014-05-28: 0.6 mg via INTRAVENOUS

## 2014-05-28 MED ORDER — ONDANSETRON HCL 4 MG/2ML IJ SOLN: 4.0000 mg | Freq: Four times a day (QID) | INTRAMUSCULAR | Status: DC | PRN

## 2014-05-28 MED ORDER — ONDANSETRON HCL 4 MG/2ML IJ SOLN
INTRAMUSCULAR | Status: AC
  Filled 2014-05-28: qty 2

## 2014-05-28 MED ORDER — NEOSTIGMINE METHYLSULFATE 10 MG/10ML IV SOLN
INTRAVENOUS | Status: AC
  Filled 2014-05-28: qty 1

## 2014-05-28 MED ORDER — THROMBIN 5000 UNITS EX SOLR
CUTANEOUS | Status: DC | PRN
  Administered 2014-05-28: 09:00:00 via TOPICAL

## 2014-05-28 MED ORDER — MIDAZOLAM HCL 2 MG/2ML IJ SOLN
INTRAMUSCULAR | Status: AC
  Filled 2014-05-28: qty 2

## 2014-05-28 MED ORDER — MENTHOL 3 MG MT LOZG
1.0000 | LOZENGE | OROMUCOSAL | Status: DC | PRN
  Filled 2014-05-28: qty 9

## 2014-05-28 MED ORDER — THROMBIN 20000 UNITS EX SOLR
CUTANEOUS | Status: DC | PRN
  Administered 2014-05-28: 09:00:00 via TOPICAL

## 2014-05-28 MED ORDER — KCL IN DEXTROSE-NACL 20-5-0.45 MEQ/L-%-% IV SOLN
INTRAVENOUS | Status: DC
  Filled 2014-05-28 (×4): qty 1000

## 2014-05-28 MED ORDER — HYDROCODONE-ACETAMINOPHEN 10-325 MG PO TABS: 1.0000 | ORAL_TABLET | ORAL | Status: DC | PRN

## 2014-05-28 MED ORDER — LACTATED RINGERS IV SOLN
INTRAVENOUS | Status: DC | PRN
  Administered 2014-05-28 (×3): via INTRAVENOUS

## 2014-05-28 MED ORDER — MIDAZOLAM HCL 5 MG/5ML IJ SOLN
INTRAMUSCULAR | Status: DC | PRN
  Administered 2014-05-28: 2 mg via INTRAVENOUS

## 2014-05-28 MED ORDER — HYDROMORPHONE HCL 1 MG/ML IJ SOLN
0.2500 mg | INTRAMUSCULAR | Status: DC | PRN
  Administered 2014-05-28 (×2): 0.5 mg via INTRAVENOUS

## 2014-05-28 MED ORDER — CYCLOBENZAPRINE HCL 10 MG PO TABS
10.0000 mg | ORAL_TABLET | Freq: Three times a day (TID) | ORAL | Status: DC | PRN
  Administered 2014-05-29: 10 mg via ORAL
  Filled 2014-05-28: qty 1

## 2014-05-28 MED ORDER — BUPIVACAINE HCL (PF) 0.5 % IJ SOLN
INTRAMUSCULAR | Status: DC | PRN
Start: 2014-05-28 — End: 2014-05-28
  Administered 2014-05-28: 8 mL

## 2014-05-28 MED ORDER — MORPHINE SULFATE 4 MG/ML IJ SOLN: 4.0000 mg | INTRAMUSCULAR | Status: DC | PRN

## 2014-05-28 MED ORDER — LIDOCAINE-EPINEPHRINE 1 %-1:100000 IJ SOLN
INTRAMUSCULAR | Status: DC | PRN
  Administered 2014-05-28: 8 mL

## 2014-05-28 MED ORDER — BISACODYL 10 MG RE SUPP: 10.0000 mg | Freq: Every day | RECTAL | Status: DC | PRN

## 2014-05-28 MED ORDER — BACITRACIN 50000 UNITS IM SOLR
INTRAMUSCULAR | Status: DC | PRN
  Administered 2014-05-28: 09:00:00

## 2014-05-28 MED ORDER — ONDANSETRON HCL 4 MG/2ML IJ SOLN
INTRAMUSCULAR | Status: DC | PRN
  Administered 2014-05-28: 4 mg via INTRAVENOUS

## 2014-05-28 MED ORDER — MAGNESIUM HYDROXIDE 400 MG/5ML PO SUSP: 30.0000 mL | Freq: Every day | ORAL | Status: DC | PRN

## 2014-05-28 MED ORDER — DEXAMETHASONE SODIUM PHOSPHATE 4 MG/ML IJ SOLN
INTRAMUSCULAR | Status: AC
  Filled 2014-05-28: qty 2

## 2014-05-28 MED ORDER — ALPRAZOLAM 0.5 MG PO TABS: 2.0000 mg | ORAL_TABLET | Freq: Four times a day (QID) | ORAL | Status: DC

## 2014-05-28 MED ORDER — ALPRAZOLAM 0.5 MG PO TABS
2.0000 mg | ORAL_TABLET | Freq: Four times a day (QID) | ORAL | Status: DC
  Administered 2014-05-28 – 2014-05-29 (×4): 2 mg via ORAL
  Filled 2014-05-28 (×4): qty 4

## 2014-05-28 MED ORDER — DEXAMETHASONE SODIUM PHOSPHATE 10 MG/ML IJ SOLN
INTRAMUSCULAR | Status: AC
  Filled 2014-05-28: qty 2

## 2014-05-28 MED ORDER — SODIUM CHLORIDE 0.9 % IJ SOLN
3.0000 mL | Freq: Two times a day (BID) | INTRAMUSCULAR | Status: DC
  Administered 2014-05-28: 3 mL via INTRAVENOUS

## 2014-05-28 MED ORDER — ACETAMINOPHEN 10 MG/ML IV SOLN
INTRAVENOUS | Status: AC
  Administered 2014-05-28: 1000 mg via INTRAVENOUS
  Filled 2014-05-28: qty 100

## 2014-05-28 MED ORDER — NEOSTIGMINE METHYLSULFATE 10 MG/10ML IV SOLN
INTRAVENOUS | Status: DC | PRN
  Administered 2014-05-28: 4 mg via INTRAVENOUS

## 2014-05-28 MED ORDER — HYDROMORPHONE HCL 1 MG/ML IJ SOLN
INTRAMUSCULAR | Status: AC
  Filled 2014-05-28: qty 1

## 2014-05-28 MED ORDER — ONDANSETRON HCL 4 MG PO TABS: 4.0000 mg | ORAL_TABLET | Freq: Four times a day (QID) | ORAL | Status: DC | PRN

## 2014-05-28 MED ORDER — HYDROXYZINE HCL 50 MG/ML IM SOLN
50.0000 mg | INTRAMUSCULAR | Status: DC | PRN
  Filled 2014-05-28: qty 1

## 2014-05-28 SURGICAL SUPPLY — 60 items
ADH SKN CLS APL DERMABOND .7 (GAUZE/BANDAGES/DRESSINGS) ×1
ADH SKN CLS LQ APL DERMABOND (GAUZE/BANDAGES/DRESSINGS) ×1
ALLOGRAFT 7X14X11 (Bone Implant) ×2 IMPLANT
ALLOGRAFT CA 6X14X11 (Bone Implant) ×1 IMPLANT
BAG DECANTER FOR FLEXI CONT (MISCELLANEOUS) ×2 IMPLANT
BIT DRILL NEURO 2X3.1 SFT TUCH (MISCELLANEOUS) ×1 IMPLANT
BLADE ULTRA TIP 2M (BLADE) ×2 IMPLANT
BRUSH SCRUB EZ PLAIN DRY (MISCELLANEOUS) ×2 IMPLANT
CANISTER SUCT 3000ML PPV (MISCELLANEOUS) ×2 IMPLANT
CONT SPEC 4OZ CLIKSEAL STRL BL (MISCELLANEOUS) ×2 IMPLANT
COVER MAYO STAND STRL (DRAPES) ×2 IMPLANT
DECANTER SPIKE VIAL GLASS SM (MISCELLANEOUS) ×1 IMPLANT
DERMABOND ADHESIVE PROPEN (GAUZE/BANDAGES/DRESSINGS) ×1
DERMABOND ADVANCED (GAUZE/BANDAGES/DRESSINGS) ×1
DERMABOND ADVANCED .7 DNX12 (GAUZE/BANDAGES/DRESSINGS) IMPLANT
DERMABOND ADVANCED .7 DNX6 (GAUZE/BANDAGES/DRESSINGS) ×1 IMPLANT
DRAPE LAPAROTOMY 100X72 PEDS (DRAPES) ×2 IMPLANT
DRAPE MICROSCOPE LEICA (MISCELLANEOUS) ×3 IMPLANT
DRAPE POUCH INSTRU U-SHP 10X18 (DRAPES) ×2 IMPLANT
DRAPE PROXIMA HALF (DRAPES) IMPLANT
DRILL NEURO 2X3.1 SOFT TOUCH (MISCELLANEOUS) ×2
ELECT COATED BLADE 2.86 ST (ELECTRODE) ×2 IMPLANT
ELECT REM PT RETURN 9FT ADLT (ELECTROSURGICAL) ×2
ELECTRODE REM PT RTRN 9FT ADLT (ELECTROSURGICAL) ×1 IMPLANT
GLOVE BIOGEL PI IND STRL 8 (GLOVE) ×1 IMPLANT
GLOVE BIOGEL PI INDICATOR 8 (GLOVE) ×3
GLOVE ECLIPSE 7.5 STRL STRAW (GLOVE) ×5 IMPLANT
GOWN STRL REUS W/ TWL LRG LVL3 (GOWN DISPOSABLE) IMPLANT
GOWN STRL REUS W/ TWL XL LVL3 (GOWN DISPOSABLE) IMPLANT
GOWN STRL REUS W/TWL 2XL LVL3 (GOWN DISPOSABLE) ×1 IMPLANT
GOWN STRL REUS W/TWL LRG LVL3 (GOWN DISPOSABLE)
GOWN STRL REUS W/TWL XL LVL3 (GOWN DISPOSABLE) ×2
HALTER HD/CHIN CERV TRACTION D (MISCELLANEOUS) ×2 IMPLANT
HEMOSTAT POWDER KIT SURGIFOAM (HEMOSTASIS) ×2 IMPLANT
KIT BASIN OR (CUSTOM PROCEDURE TRAY) ×2 IMPLANT
KIT ROOM TURNOVER OR (KITS) ×2 IMPLANT
NDL HYPO 25X1 1.5 SAFETY (NEEDLE) ×1 IMPLANT
NDL SPNL 22GX3.5 QUINCKE BK (NEEDLE) ×1 IMPLANT
NEEDLE HYPO 25X1 1.5 SAFETY (NEEDLE) ×2 IMPLANT
NEEDLE SPNL 22GX3.5 QUINCKE BK (NEEDLE) ×2 IMPLANT
NS IRRIG 1000ML POUR BTL (IV SOLUTION) ×2 IMPLANT
PACK LAMINECTOMY NEURO (CUSTOM PROCEDURE TRAY) ×2 IMPLANT
PAD ARMBOARD 7.5X6 YLW CONV (MISCELLANEOUS) ×6 IMPLANT
PLATE CERV 55MM 3LV (Plate) ×1 IMPLANT
RUBBERBAND STERILE (MISCELLANEOUS) ×6 IMPLANT
SCREW FIX 4.0X14MM (Screw) ×2 IMPLANT
SCREW FIX 4.0X15MM (Screw) ×1 IMPLANT
SCREW VAR 4.0X15MM (Screw) ×1 IMPLANT
SCREW VAR 4.0X16MM (Screw) ×2 IMPLANT
SPONGE INTESTINAL PEANUT (DISPOSABLE) ×2 IMPLANT
SPONGE SURGIFOAM ABS GEL 100 (HEMOSTASIS) ×2 IMPLANT
STAPLER SKIN PROX WIDE 3.9 (STAPLE) IMPLANT
SUT VIC AB 0 CT1 18XCR BRD8 (SUTURE) IMPLANT
SUT VIC AB 0 CT1 8-18 (SUTURE)
SUT VIC AB 2-0 CP2 18 (SUTURE) ×2 IMPLANT
SUT VIC AB 3-0 SH 8-18 (SUTURE) ×2 IMPLANT
SYR 20ML ECCENTRIC (SYRINGE) ×2 IMPLANT
TOWEL OR 17X24 6PK STRL BLUE (TOWEL DISPOSABLE) ×1 IMPLANT
TOWEL OR 17X26 10 PK STRL BLUE (TOWEL DISPOSABLE) ×2 IMPLANT
WATER STERILE IRR 1000ML POUR (IV SOLUTION) ×2 IMPLANT

## 2014-05-28 NOTE — Progress Notes (Signed)
Filed Vitals:   05/28/14 1200 05/28/14 1206 05/28/14 1222 05/28/14 1703  BP:   121/75 136/84  Pulse: 93 99 90 67  Temp:  98.5 F (36.9 C) 98.2 F (36.8 C) 98.2 F (36.8 C)  TempSrc:      Resp: 15 16 16 16   Height:      Weight:      SpO2: 99% 99% 95% 99%    Patient up and ambulating. Comfortable. Moving all extremities well. Wound clean and dry. Voiding well.  Plan: Continue to progress through postoperative recovery.  Hewitt ShortsNUDELMAN,ROBERT W, MD 05/28/2014, 7:02 PM
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
## 2014-05-28 NOTE — Anesthesia Preprocedure Evaluation (Addendum)
Anesthesia Evaluation  Patient identified by MRN, date of birth, ID band Patient awake    Reviewed: Allergy & Precautions, NPO status , Patient's Chart, lab work & pertinent test results  Airway Mallampati: II  TM Distance: >3 FB Neck ROM: Full    Dental no notable dental hx. (+) Dental Advisory Given   Pulmonary shortness of breath and with exertion, former smoker,  breath sounds clear to auscultation  Pulmonary exam normal       Cardiovascular negative cardio ROS Normal cardiovascular examRhythm:Regular Rate:Normal     Neuro/Psych Anxiety negative neurological ROS     GI/Hepatic negative GI ROS, Neg liver ROS,   Endo/Other  negative endocrine ROS  Renal/GU negative Renal ROS  negative genitourinary   Musculoskeletal negative musculoskeletal ROS (+) Arthritis -,   Abdominal   Peds negative pediatric ROS (+)  Hematology negative hematology ROS (+)   Anesthesia Other Findings   Reproductive/Obstetrics negative OB ROS                            Anesthesia Physical Anesthesia Plan  ASA: II  Anesthesia Plan: General   Post-op Pain Management:    Induction: Intravenous  Airway Management Planned: Oral ETT  Additional Equipment:   Intra-op Plan:   Post-operative Plan: Extubation in OR  Informed Consent: I have reviewed the patients History and Physical, chart, labs and discussed the procedure including the risks, benefits and alternatives for the proposed anesthesia with the patient or authorized representative who has indicated his/her understanding and acceptance.   Dental advisory given  Plan Discussed with: Anesthesiologist and Surgeon  Anesthesia Plan Comments:         Anesthesia Quick Evaluation

## 2014-05-28 NOTE — Op Note (Signed)
05/28/2014  11:36 AM  PATIENT:  Johna Sherifficky L Wohl  54 y.o. male  PRE-OPERATIVE DIAGNOSIS:  cervical spondylosis with radiculopathy, cervical degenerative disc disease, cervicalgia  POST-OPERATIVE DIAGNOSIS:  cervical spondylosis with radiculopathy, cervical degenerative disc disease, cervicalgia  PROCEDURE:  Procedure(s):  C4-5, C5-6, and C6-7 anterior cervical decompression and arthrodesis with structural allograft and tether cervical plating  SURGEON:  Surgeon(s): Shirlean Kellyobert Nudelman, MD Tressie StalkerJeffrey Jenkins, MD  ASSISTANTS: Tressie StalkerJeffrey Jenkins, M.D.  ANESTHESIA:   general  EBL:  Total I/O In: 2200 [I.V.:2200] Out: 410 [Urine:285; Blood:125]  BLOOD ADMINISTERED:none  COUNT: Correct per nursing staff  DICTATION: Patient was brought to the operating room placed under general endotracheal anesthesia. Patient was placed in 10 pounds of halter traction. The neck was prepped with Betadine soap and solution and draped in a sterile fashion. A obliquel incision was made on the left side of the neck paralleling the anterior border of the sternocleidomastoid.. The line of the incision was infiltrated with local anesthetic with epinephrine. Dissection was carried down thru the subcutaneous tissue and platysma, bipolar cautery was used to maintain hemostasis. Dissection was then carried out thru an avascular plane leaving the sternocleidomastoid carotid artery and jugular vein laterally and the trachea and esophagus medially. The ventral aspect of the vertebral column was identified and a localizing x-ray was taken. The C4-5, C5-6, and C6-7 levels were identified. The annulus at each level was incised and the disc space entered. Discectomy was performed with micro-curettes and pituitary rongeurs. The operating microscope was draped and brought into the field provided additional magnification illumination and visualization. Discectomy was continued posteriorly thru the disc space and then the cartilaginous endplate  was removed using micro-curettes along with the high-speed drill. Posterior osteophytic overgrowth was removed at each level using the high-speed drill along with a 2 mm thin footplated Kerrison punch. Posterior longitudinal ligament along with spondylitic overgrowth was carefully removed, decompressing the spinal canal and thecal sac. We then continued to remove osteophytic overgrowth and disc material decompressing the neural foramina and exiting nerve roots bilaterally. Once the decompression was completed hemostasis was established at each level with the use of Gelfoam with thrombin and Surgifoam. The Gelfoam was removed the wound irrigated and hemostasis confirmed. We then measured the height of each intravertebral disc space level and selected a 7 millimeter in height structural allograft for the C4-5 level, a 6 millimeter in height structural allograft for the C5-6 level, and a 7 millimeter in height structural allograft for the C6-7 level . Each was hydrated in saline solution and then gently positioned in the intravertebral disc space and countersunk. We then selected a 55 millimeter in height Tether cervical plate. It was positioned over the fusion construct and secured to the vertebra with variety of variable and fixed screws. Each screw hole was started with the high-speed drill and then the screws placed, once all the screws were placed final tightening was performed. The wound was irrigated with bacitracin solution checked for hemostasis which was established and confirmed. An x-ray was taken which showed the upper portion of the fusion construct, the graft at C4-5 was in good position as were the plate and screws, and were able to examine the fusion construct in situ and all the grafts, screws and plate appeared in good position. We then proceeded with closure. The platysma was closed with interrupted inverted 2-0 undyed Vicryl suture, the subcutaneous and subcuticular closed with interrupted inverted  3-0 undyed Vicryl suture. The skin edges were approximated with Dermabond. Following  surgery the patient was taken out of cervical traction. To be reversed and the anesthetic and taken to the recovery room for further care.  PLAN OF CARE: Admit to inpatient   PATIENT DISPOSITION:  PACU - hemodynamically stable.   Delay start of Pharmacological VTE agent (>24hrs) due to surgical blood loss or risk of bleeding:  yes

## 2014-05-28 NOTE — Anesthesia Procedure Notes (Signed)
Procedure Name: Intubation Date/Time: 05/28/2014 7:29 AM Performed by: Merrilyn Puma B Pre-anesthesia Checklist: Patient identified, Timeout performed, Emergency Drugs available, Suction available and Patient being monitored Patient Re-evaluated:Patient Re-evaluated prior to inductionOxygen Delivery Method: Circle system utilized Preoxygenation: Pre-oxygenation with 100% oxygen Intubation Type: IV induction Ventilation: Mask ventilation without difficulty Laryngoscope Size: 4 and Mac Grade View: Grade I Tube type: Oral Tube size: 7.5 mm Number of attempts: 1 Airway Equipment and Method: Stylet and LTA kit utilized Placement Confirmation: positive ETCO2,  CO2 detector,  breath sounds checked- equal and bilateral and ETT inserted through vocal cords under direct vision Secured at: 23 cm Tube secured with: Tape Dental Injury: Teeth and Oropharynx as per pre-operative assessment

## 2014-05-28 NOTE — Anesthesia Postprocedure Evaluation (Signed)
  Anesthesia Post-op Note  Patient: [name]  Procedure(s) Performed: Procedure(s) (LRB): CERVICAL FOUR-FIVE, CERVICAL FIVE-SIX, CERVICAL SIX-SEVEN ANTERIOR CERVICAL DECOMPRESSION/DISCECTOMY FUSION (N/A)  Patient Location: PACU  Anesthesia Type: General  Level of Consciousness: awake and alert   Airway and Oxygen Therapy: Patient Spontanous Breathing  Post-op Pain: mild  Post-op Assessment: Post-op Vital signs reviewed, Patient's Cardiovascular Status Stable, Respiratory Function Stable, Patent Airway and No signs of Nausea or vomiting  Last Vitals:  Filed Vitals:   05/28/14 1206  BP:   Pulse: 99  Temp: 36.9 C  Resp: 16    Post-op Vital Signs: stable   Complications: No apparent anesthesia complications

## 2014-05-28 NOTE — Transfer of Care (Signed)
Immediate Anesthesia Transfer of Care Note  Patient: Alan SheriffRicky L [name]  Procedure(s) Performed: Procedure(s) with comments: CERVICAL FOUR-FIVE, CERVICAL FIVE-SIX, CERVICAL SIX-SEVEN ANTERIOR CERVICAL DECOMPRESSION/DISCECTOMY FUSION (N/A) - C45 C56 C67 anterior cervical decompression with fusion plating and bonegraft  Patient Location: PACU  Anesthesia Type:General  Level of Consciousness: sedated  Airway & Oxygen Therapy: Patient Spontanous Breathing and Patient connected to nasal cannula oxygen  Post-op Assessment: Report given to RN, Post -op Vital signs reviewed and stable and Patient moving all extremities X 4  Post vital signs: Reviewed and stable  Last Vitals:  Filed Vitals:   05/28/14 1137  BP:   Pulse: 109  Temp:   Resp: 18    Complications: No apparent anesthesia complications

## 2014-05-29 MED ORDER — OXYCODONE-ACETAMINOPHEN 5-325 MG PO TABS: 1.0000 | ORAL_TABLET | ORAL | Status: DC | PRN

## 2014-05-29 NOTE — Discharge Instructions (Signed)

## 2014-05-29 NOTE — Discharge Summary (Signed)
Physician Discharge Summary  Patient ID: [name] MRN: [id number] DOB/AGE: [birth year] 54 y.o.  Admit date: 05/28/2014 Discharge date: 05/29/2014  Admission Diagnoses:  cervical spondylosis with radiculopathy, cervical degenerative disc disease, cervicalgia  Discharge Diagnoses:  cervical spondylosis with radiculopathy, cervical degenerative disc disease, cervicalgia Active Problems:   Cervical spondylosis with radiculopathy   Discharged Condition: good  Hospital Course: Patient admitted, underwent a 3 level CIV-5, C5-6, and C6-7 ACDF. He is done well following surgery. He is up and ambulate actively. He is voiding well. His incision is healing nicely. He has been given instructions regarding wound care and activities following discharge. He is to return for follow-up with me in 3 weeks.  Discharge Exam: Blood pressure 135/87, pulse 72, temperature 97.8 F (36.6 C), resp. rate 20, height 6' (1.829 m), weight 89.812 kg (198 lb), SpO2 98 %.  Disposition: Home     Medication List    STOP taking these medications        meclizine 25 MG tablet  Commonly known as:  ANTIVERT     methylPREDNISolone 4 MG tablet  Commonly known as:  MEDROL DOSEPAK      TAKE these medications        alprazolam 2 MG tablet  Commonly known as:  XANAX  Take 2 mg by mouth 4 (four) times daily.     aspirin 325 MG tablet  Take 1 tablet (325 mg total) by mouth daily.     cyclobenzaprine 10 MG tablet  Commonly known as:  FLEXERIL  Take 10 mg by mouth 3 (three) times daily as needed for muscle spasms.     HYDROcodone-acetaminophen 10-325 MG per tablet  Commonly known as:  NORCO  Take 1-2 tablets by mouth every 4 (four) hours as needed for moderate pain. For pain     oxyCODONE-acetaminophen 5-325 MG per tablet  Commonly known as:  PERCOCET/ROXICET  1 to 2 tabs PO q6hrs  PRN for pain     oxyCODONE-acetaminophen 5-325 MG per tablet  Commonly known as:  PERCOCET/ROXICET  Take 1-2 tablets by  mouth every 4 (four) hours as needed for moderate pain.     vitamin B-12 1000 MCG tablet  Commonly known as:  CYANOCOBALAMIN  Take 1,000 mcg by mouth daily.         Signed: Hewitt ShortsNUDELMAN,ROBERT W, MD 05/29/2014, 7:36 AM

## 2014-05-29 NOTE — Progress Notes (Signed)
Pt given D/C instructions with Rx, verbal understanding was provided. Pt's incision is open to air and has no sign of infection. Pt's IV was removed prior to D/C. Pt D/C'd home via walking @ 1005 per MD order. Pt is stable @ D/C and has no other needs at this time. Pt is stable @ D/C and has no other needs at this time. Rema FendtAshley Amaury Kuzel, RN

## 2014-05-30 ENCOUNTER — Encounter (HOSPITAL_COMMUNITY): Payer: Self-pay | Admitting: Neurosurgery

## 2014-07-04 ENCOUNTER — Telehealth: Payer: Self-pay | Admitting: Cardiology

## 2014-07-04 NOTE — Telephone Encounter (Signed)
Received records from [facility] for appointment on 07/05/14 with Dr Herbie Baltimore.  Records given to [facility] (medical records) for Dr Elissa Hefty schedule on 07/05/14. lp

## 2014-07-05 ENCOUNTER — Ambulatory Visit: Payer: Medicaid Other | Admitting: Cardiology

## 2014-08-03 ENCOUNTER — Emergency Department (HOSPITAL_COMMUNITY): Payer: Medicaid Other

## 2014-08-03 ENCOUNTER — Emergency Department (HOSPITAL_COMMUNITY)
Admission: EM | Admit: 2014-08-03 | Discharge: 2014-08-03 | Disposition: A | Discharge status: Home / Self Care | Payer: Medicaid Other | Attending: Emergency Medicine | Admitting: Emergency Medicine

## 2014-08-03 ENCOUNTER — Encounter (HOSPITAL_COMMUNITY): Payer: Self-pay | Admitting: Emergency Medicine

## 2014-08-03 DIAGNOSIS — R531 Weakness: Secondary | ICD-10-CM | POA: Insufficient documentation

## 2014-08-03 DIAGNOSIS — Z8739 Personal history of other diseases of the musculoskeletal system and connective tissue: Secondary | ICD-10-CM | POA: Insufficient documentation

## 2014-08-03 DIAGNOSIS — R072 Precordial pain: Secondary | ICD-10-CM | POA: Diagnosis present

## 2014-08-03 DIAGNOSIS — R0602 Shortness of breath: Secondary | ICD-10-CM | POA: Insufficient documentation

## 2014-08-03 DIAGNOSIS — R0789 Other chest pain: Secondary | ICD-10-CM | POA: Diagnosis not present

## 2014-08-03 DIAGNOSIS — Z79899 Other long term (current) drug therapy: Secondary | ICD-10-CM | POA: Diagnosis not present

## 2014-08-03 DIAGNOSIS — Z87891 Personal history of nicotine dependence: Secondary | ICD-10-CM | POA: Diagnosis not present

## 2014-08-03 DIAGNOSIS — F419 Anxiety disorder, unspecified: Secondary | ICD-10-CM | POA: Insufficient documentation

## 2014-08-03 DIAGNOSIS — Z7982 Long term (current) use of aspirin: Secondary | ICD-10-CM | POA: Diagnosis not present

## 2014-08-03 DIAGNOSIS — R079 Chest pain, unspecified: Secondary | ICD-10-CM

## 2014-08-03 LAB — CBC WITH DIFFERENTIAL/PLATELET
BASOS PCT: 0 % (ref 0–1)
Basophils Absolute: 0 10*3/uL (ref 0.0–0.1)
EOS PCT: 4 % (ref 0–5)
Eosinophils Absolute: 0.2 10*3/uL (ref 0.0–0.7)
HEMATOCRIT: 45.8 % (ref 39.0–52.0)
Hemoglobin: 15.2 g/dL (ref 13.0–17.0)
LYMPHS ABS: 1.8 10*3/uL (ref 0.7–4.0)
LYMPHS PCT: 35 % (ref 12–46)
MCH: 29 pg (ref 26.0–34.0)
MCHC: 33.2 g/dL (ref 30.0–36.0)
MCV: 87.4 fL (ref 78.0–100.0)
Monocytes Absolute: 0.5 10*3/uL (ref 0.1–1.0)
Monocytes Relative: 9 % (ref 3–12)
NEUTROS PCT: 52 % (ref 43–77)
Neutro Abs: 2.7 10*3/uL (ref 1.7–7.7)
Platelets: 181 10*3/uL (ref 150–400)
RBC: 5.24 MIL/uL (ref 4.22–5.81)
RDW: 13.5 % (ref 11.5–15.5)
WBC: 5.2 10*3/uL (ref 4.0–10.5)

## 2014-08-03 LAB — COMPREHENSIVE METABOLIC PANEL
ALBUMIN: 4.4 g/dL (ref 3.5–5.0)
ALK PHOS: 94 U/L (ref 38–126)
ALT: 18 U/L (ref 17–63)
AST: 22 U/L (ref 15–41)
Anion gap: 8 (ref 5–15)
BILIRUBIN TOTAL: 0.7 mg/dL (ref 0.3–1.2)
BUN: 7 mg/dL (ref 6–20)
CHLORIDE: 101 mmol/L (ref 101–111)
CO2: 29 mmol/L (ref 22–32)
CREATININE: 1.42 mg/dL — AB (ref 0.61–1.24)
Calcium: 9 mg/dL (ref 8.9–10.3)
GFR calc non Af Amer: 54 mL/min — ABNORMAL LOW (ref >60)
GLUCOSE: 75 mg/dL (ref 65–99)
POTASSIUM: 3.4 mmol/L — AB (ref 3.5–5.1)
Sodium: 138 mmol/L (ref 135–145)
Total Protein: 7 g/dL (ref 6.5–8.1)

## 2014-08-03 LAB — TROPONIN I: Troponin I: <0.03 ng/mL (ref <0.031)

## 2014-08-03 LAB — BRAIN NATRIURETIC PEPTIDE: B Natriuretic Peptide: 9 pg/mL (ref 0.0–100.0)

## 2014-08-03 MED ORDER — TRAMADOL HCL 50 MG PO TABS: 50.0000 mg | ORAL_TABLET | Freq: Four times a day (QID) | ORAL | Status: DC | PRN

## 2014-08-03 NOTE — Discharge Instructions (Signed)

## 2014-08-03 NOTE — ED Notes (Signed)
Patient c/o sudden mid-sternal chest pain that started this morning. Per patient has had extreme weakness with shortness of breath on exertion "for a while" which he states he has had multiple test done and showed nothing, PCP concerned about heart and patient is supposed to follow up with cardiologist. Patient reports taking his hydrocodone, muscle relaxer, and xanax with some relief.
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
## 2014-08-03 NOTE — ED Notes (Signed)
In to discharge pt. Pt states he is not ready to go home. States he just woke up and is chest is hurting again. Rates pain 7/10. EDP aware

## 2014-08-03 NOTE — ED Notes (Signed)
Patient with no complaints at this time. Respirations even and unlabored. Skin warm/dry. Discharge instructions reviewed with patient at this time. Patient given opportunity to voice concerns/ask questions. IV removed per policy and band-aid applied to site. Patient discharged at this time and left Emergency Department with steady gait.  

## 2014-08-22 ENCOUNTER — Other Ambulatory Visit: Payer: Self-pay | Admitting: Cardiology

## 2014-08-22 DIAGNOSIS — R079 Chest pain, unspecified: Secondary | ICD-10-CM

## 2014-08-29 ENCOUNTER — Telehealth: Payer: Self-pay | Admitting: Internal Medicine

## 2014-08-29 ENCOUNTER — Encounter (HOSPITAL_COMMUNITY)
Admission: RE | Admit: 2014-08-29 | Discharge: 2014-08-29 | Disposition: A | Discharge status: Home / Self Care | Payer: Medicaid Other | Source: Ambulatory Visit | Attending: Cardiology | Admitting: Cardiology

## 2014-08-29 ENCOUNTER — Ambulatory Visit (HOSPITAL_COMMUNITY)
Admission: RE | Admit: 2014-08-29 | Discharge: 2014-08-29 | Disposition: A | Discharge status: Home / Self Care | Payer: Medicaid Other | Source: Ambulatory Visit | Attending: Cardiology | Admitting: Cardiology

## 2014-08-29 DIAGNOSIS — R0602 Shortness of breath: Secondary | ICD-10-CM | POA: Insufficient documentation

## 2014-08-29 DIAGNOSIS — R079 Chest pain, unspecified: Secondary | ICD-10-CM | POA: Insufficient documentation

## 2014-08-29 DIAGNOSIS — I10 Essential (primary) hypertension: Secondary | ICD-10-CM | POA: Insufficient documentation

## 2014-08-29 MED ORDER — REGADENOSON 0.4 MG/5ML IV SOLN
INTRAVENOUS | Status: AC
  Administered 2014-08-29: 0.4 mg via INTRAVENOUS
  Filled 2014-08-29: qty 5

## 2014-08-29 MED ORDER — REGADENOSON 0.4 MG/5ML IV SOLN
0.4000 mg | Freq: Once | INTRAVENOUS | Status: AC
Start: 2014-08-29 — End: 2014-08-29
  Administered 2014-08-29: 0.4 mg via INTRAVENOUS

## 2014-08-29 MED ORDER — TECHNETIUM TC 99M SESTAMIBI GENERIC - CARDIOLITE: 10.0000 | Freq: Once | INTRAVENOUS | Status: DC | PRN

## 2014-08-29 MED ORDER — TECHNETIUM TC 99M SESTAMIBI GENERIC - CARDIOLITE
30.0000 | Freq: Once | INTRAVENOUS | Status: AC | PRN
  Administered 2014-08-29: 30 via INTRAVENOUS

## 2014-08-29 NOTE — Telephone Encounter (Signed)
Unable to locate where someone from this office called patient. Informed him it may have been a reminder call about this OV 8/22.

## 2014-08-29 NOTE — Telephone Encounter (Signed)
Patient states he missed a call from this number.

## 2014-09-03 ENCOUNTER — Ambulatory Visit: Payer: Medicaid Other | Admitting: Internal Medicine

## 2015-08-08 ENCOUNTER — Ambulatory Visit (INDEPENDENT_AMBULATORY_CARE_PROVIDER_SITE_OTHER): Payer: Medicaid Other | Admitting: Neurology

## 2015-08-08 ENCOUNTER — Encounter: Payer: Self-pay | Admitting: Neurology

## 2015-08-08 VITALS — BP 104/76 | HR 86 | Resp 16 | Ht 72.0 in | Wt 174.5 lb

## 2015-08-08 DIAGNOSIS — G459 Transient cerebral ischemic attack, unspecified: Secondary | ICD-10-CM

## 2015-08-08 DIAGNOSIS — M4722 Other spondylosis with radiculopathy, cervical region: Secondary | ICD-10-CM

## 2015-08-08 DIAGNOSIS — F411 Generalized anxiety disorder: Secondary | ICD-10-CM

## 2015-08-08 DIAGNOSIS — R200 Anesthesia of skin: Secondary | ICD-10-CM | POA: Insufficient documentation

## 2015-08-08 DIAGNOSIS — R208 Other disturbances of skin sensation: Secondary | ICD-10-CM

## 2015-08-08 MED ORDER — RIZATRIPTAN BENZOATE 10 MG PO TBDP: 10.0000 mg | ORAL_TABLET | ORAL | 11 refills | Status: DC | PRN

## 2015-08-08 MED ORDER — OXYCODONE-ACETAMINOPHEN 5-325 MG PO TABS: ORAL_TABLET | ORAL | 0 refills | Status: DC
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
## 2015-08-15 ENCOUNTER — Ambulatory Visit (INDEPENDENT_AMBULATORY_CARE_PROVIDER_SITE_OTHER): Payer: Medicaid Other

## 2015-08-15 ENCOUNTER — Other Ambulatory Visit: Payer: Self-pay | Admitting: Neurology

## 2015-08-15 DIAGNOSIS — R208 Other disturbances of skin sensation: Secondary | ICD-10-CM | POA: Diagnosis not present

## 2015-08-15 DIAGNOSIS — G459 Transient cerebral ischemic attack, unspecified: Secondary | ICD-10-CM

## 2015-08-15 DIAGNOSIS — R200 Anesthesia of skin: Secondary | ICD-10-CM

## 2015-08-15 DIAGNOSIS — R55 Syncope and collapse: Secondary | ICD-10-CM

## 2015-08-19 ENCOUNTER — Ambulatory Visit
Admission: RE | Admit: 2015-08-19 | Discharge: 2015-08-19 | Disposition: A | Discharge status: Home / Self Care | Payer: Medicaid Other | Source: Ambulatory Visit | Attending: Neurology | Admitting: Neurology

## 2015-08-19 DIAGNOSIS — G459 Transient cerebral ischemic attack, unspecified: Secondary | ICD-10-CM

## 2015-08-19 DIAGNOSIS — R208 Other disturbances of skin sensation: Secondary | ICD-10-CM | POA: Diagnosis not present

## 2015-08-19 DIAGNOSIS — R200 Anesthesia of skin: Secondary | ICD-10-CM

## 2015-08-19 MED ORDER — GADOBENATE DIMEGLUMINE 529 MG/ML IV SOLN
15.0000 mL | Freq: Once | INTRAVENOUS | Status: AC | PRN
  Administered 2015-08-19: 15 mL via INTRAVENOUS

## 2015-08-20 ENCOUNTER — Telehealth: Payer: Self-pay | Admitting: *Deleted

## 2015-08-20 NOTE — Telephone Encounter (Signed)
I have spoken with pt. this afternoon, and per RAS, advised that MRI brain and arteries around the brain show no changes when  compared with previous study.  He verbalized understanding of same/fim

## 2015-08-20 NOTE — Telephone Encounter (Signed)
-----   Message from Asa Lenteichard A Sater, MD sent at 08/20/2015  8:44 AM EDT ----- Please let Mr. [name] know that the MRI of the brain and brain arteries did not show any changes compared to the one he had had previously

## 2015-08-26 ENCOUNTER — Telehealth: Payer: Self-pay | Admitting: *Deleted

## 2015-08-26 NOTE — Telephone Encounter (Signed)
See result note/fim 

## 2015-08-26 NOTE — Telephone Encounter (Signed)
-----   Message from Richard A Sater, MD sent at 08/23/2015  4:18 PM EDT ----- Please let him know that the Holter monitor did not show any significant arrhythmia. 

## 2015-08-26 NOTE — Telephone Encounter (Signed)
I have spoken with [name] this morning and per RAS, advised holter monitor showed no sig. cardiac arrhythmias.  He verbalized understanding of same/fim

## 2015-08-26 NOTE — Telephone Encounter (Signed)
Pt returned call

## 2015-08-26 NOTE — Telephone Encounter (Signed)
-----   Message from Asa Lenteichard A Sater, MD sent at 08/23/2015  4:18 PM EDT ----- Please let him know that the Holter monitor did not show any significant arrhythmia.

## 2015-08-26 NOTE — Telephone Encounter (Signed)
LMTC./fim 

## 2015-10-09 ENCOUNTER — Ambulatory Visit: Payer: Medicaid Other | Admitting: Neurology

## 2015-10-17 ENCOUNTER — Encounter: Payer: Self-pay | Admitting: Neurology

## 2015-11-12 ENCOUNTER — Ambulatory Visit (INDEPENDENT_AMBULATORY_CARE_PROVIDER_SITE_OTHER): Payer: Medicaid Other | Admitting: Neurology

## 2015-11-12 ENCOUNTER — Encounter: Payer: Self-pay | Admitting: Neurology

## 2015-11-12 VITALS — BP 106/72 | HR 70 | Resp 18 | Ht 72.0 in | Wt 181.0 lb

## 2015-11-12 DIAGNOSIS — M1990 Unspecified osteoarthritis, unspecified site: Secondary | ICD-10-CM

## 2015-11-12 DIAGNOSIS — G459 Transient cerebral ischemic attack, unspecified: Secondary | ICD-10-CM | POA: Diagnosis not present

## 2015-11-12 DIAGNOSIS — R42 Dizziness and giddiness: Secondary | ICD-10-CM | POA: Diagnosis not present

## 2015-11-12 DIAGNOSIS — R930 Abnormal findings on diagnostic imaging of skull and head, not elsewhere classified: Secondary | ICD-10-CM

## 2015-11-12 DIAGNOSIS — R200 Anesthesia of skin: Secondary | ICD-10-CM | POA: Diagnosis not present

## 2015-11-12 DIAGNOSIS — R9082 White matter disease, unspecified: Secondary | ICD-10-CM | POA: Insufficient documentation
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
## 2015-11-25 ENCOUNTER — Ambulatory Visit (INDEPENDENT_AMBULATORY_CARE_PROVIDER_SITE_OTHER): Payer: Medicaid Other | Admitting: Neurology

## 2015-11-25 DIAGNOSIS — R55 Syncope and collapse: Secondary | ICD-10-CM

## 2015-11-25 DIAGNOSIS — G459 Transient cerebral ischemic attack, unspecified: Secondary | ICD-10-CM

## 2015-11-25 DIAGNOSIS — R42 Dizziness and giddiness: Secondary | ICD-10-CM

## 2015-11-26 NOTE — Progress Notes (Signed)
   [facility]  EEG (ELECTROENCEPHALOGRAM) REPORT   STUDY DATE: 11/25/2015 PATIENT NAME: [name] DOB: 04-13-1960 MRN: [id number]  ORDERING CLINICIAN: Richard A. Epimenio FootSater, MD. PhD  TECHNOLOGIST: Gearldine ShownLorraine Jones TECHNIQUE: Electroencephalogram was recorded utilizing standard 10-20 system of lead placement and reformatted into average and bipolar montages.  RECORDING TIME: 22 minutes ACTIVATION: Photic stimulation and hyperventilation  CLINICAL INFORMATION: 55 year old man with episodes of lightheadedness and neurologic symptoms  FINDINGS:   There is a 10 Hz posterior dominant rhythm that reacted to eye opening and closing. There was intermixed symmetrical the more anteriorly. There were no sharp waves, spikes or other epileptiform activity. The patient became drowsy but did not enter definitive sleep. Normal driving response to photic stimulation. Hyperventilation and recovery did not change the underlying rhythms.  IMPRESSION: This is a normal EEG while the patient was awake and drowsy.   INTERPRETING PHYSICIAN:   Richard A. Epimenio FootSater, MD, PhD Certified in Neurology, Clinical Neurophysiology, Sleep Medicine, Pain Medicine and Neuroimaging  [facility] [address], [address] [place], [place] [address] [phone]

## 2015-11-27 ENCOUNTER — Telehealth: Payer: Self-pay | Admitting: *Deleted

## 2015-11-27 NOTE — Telephone Encounter (Signed)
I have spoken with [name] this morning and per RAS, advised him that EEG is normal.  He verbalized understanding of same/fim

## 2015-11-27 NOTE — Telephone Encounter (Signed)
-----   Message from Asa Lenteichard A Sater, MD sent at 11/26/2015  5:34 PM EST ----- Please let him know that the EEG was normal.

## 2016-01-02 ENCOUNTER — Encounter: Payer: Self-pay | Admitting: *Deleted

## 2016-01-28 ENCOUNTER — Encounter: Payer: Self-pay | Admitting: *Deleted

## 2016-02-12 ENCOUNTER — Ambulatory Visit: Payer: Medicaid Other

## 2016-02-12 DIAGNOSIS — R42 Dizziness and giddiness: Secondary | ICD-10-CM | POA: Diagnosis not present

## 2016-02-12 DIAGNOSIS — G459 Transient cerebral ischemic attack, unspecified: Secondary | ICD-10-CM | POA: Diagnosis not present

## 2016-02-13 ENCOUNTER — Telehealth: Payer: Self-pay | Admitting: *Deleted

## 2016-02-13 NOTE — Telephone Encounter (Signed)
-----   Message from Asa Lenteichard A Sater, MD sent at 02/13/2016  8:28 AM EST ----- Please let him know the carotid dopplers were normal

## 2016-02-13 NOTE — Telephone Encounter (Signed)
LMOM that per RAS, carotid duplex was normal.  He does not need to return this call unless he has questions/fim

## 2016-02-13 NOTE — Telephone Encounter (Signed)
I have spoken with [name] this afternoon and explained that carotid doppler and EEG were both normal--RAS will discuss any other testing or follow up that needs to be done at his pending 03-10-16 appt/fim

## 2016-02-13 NOTE — Telephone Encounter (Signed)
Patient is aware of Carotid duplex results, but patient states he still is having symptoms.  Patient wants to know if he needs to go back to his PCP, another specialist, or some back to see Dr. Epimenio FootSater.  Please call

## 2016-03-11 ENCOUNTER — Ambulatory Visit: Payer: Medicaid Other | Admitting: Neurology

## 2016-03-17 ENCOUNTER — Encounter: Payer: Self-pay | Admitting: Neurology

## 2016-06-14 ENCOUNTER — Encounter (HOSPITAL_COMMUNITY): Payer: Self-pay | Admitting: Emergency Medicine

## 2016-06-14 ENCOUNTER — Emergency Department (HOSPITAL_COMMUNITY)
Admission: EM | Admit: 2016-06-14 | Discharge: 2016-06-14 | Disposition: A | Discharge status: Home / Self Care | Payer: Medicaid Other | Attending: Emergency Medicine | Admitting: Emergency Medicine

## 2016-06-14 DIAGNOSIS — Z7982 Long term (current) use of aspirin: Secondary | ICD-10-CM | POA: Diagnosis not present

## 2016-06-14 DIAGNOSIS — Z87891 Personal history of nicotine dependence: Secondary | ICD-10-CM | POA: Diagnosis not present

## 2016-06-14 DIAGNOSIS — Y939 Activity, unspecified: Secondary | ICD-10-CM | POA: Insufficient documentation

## 2016-06-14 DIAGNOSIS — X58XXXA Exposure to other specified factors, initial encounter: Secondary | ICD-10-CM | POA: Insufficient documentation

## 2016-06-14 DIAGNOSIS — Y999 Unspecified external cause status: Secondary | ICD-10-CM | POA: Insufficient documentation

## 2016-06-14 DIAGNOSIS — S39012A Strain of muscle, fascia and tendon of lower back, initial encounter: Secondary | ICD-10-CM | POA: Diagnosis not present

## 2016-06-14 DIAGNOSIS — M5136 Other intervertebral disc degeneration, lumbar region: Secondary | ICD-10-CM | POA: Diagnosis not present

## 2016-06-14 DIAGNOSIS — Y929 Unspecified place or not applicable: Secondary | ICD-10-CM | POA: Insufficient documentation

## 2016-06-14 DIAGNOSIS — S3992XA Unspecified injury of lower back, initial encounter: Secondary | ICD-10-CM | POA: Diagnosis present

## 2016-06-14 HISTORY — DX: Degenerative disease of nervous system, unspecified: G31.9

## 2016-06-14 MED ORDER — DIAZEPAM 5 MG PO TABS
10.0000 mg | ORAL_TABLET | Freq: Once | ORAL | Status: AC
  Administered 2016-06-14: 10 mg via ORAL
  Filled 2016-06-14: qty 2

## 2016-06-14 MED ORDER — DEXAMETHASONE SODIUM PHOSPHATE 4 MG/ML IJ SOLN
8.0000 mg | Freq: Once | INTRAMUSCULAR | Status: AC
  Administered 2016-06-14: 8 mg via INTRAMUSCULAR
  Filled 2016-06-14: qty 2

## 2016-06-14 MED ORDER — MORPHINE SULFATE (PF) 4 MG/ML IV SOLN
8.0000 mg | Freq: Once | INTRAVENOUS | Status: AC
  Administered 2016-06-14: 8 mg via INTRAMUSCULAR
  Filled 2016-06-14: qty 2
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
## 2016-06-14 NOTE — ED Triage Notes (Signed)
Patient c/o lower back pain that started this morning. Denies any known injury. Denies any urination symptoms or complications with BM. Per patient has not had BM in a week but states, not unusual. Patient reports pain increases with movement. Patient reports taking hydrocodone, xanax, and flexeril at 12 with no relief.

## 2016-06-14 NOTE — Discharge Instructions (Signed)
Please rest your back is much as possible. Apply heating pad to the affected area. Please resume your medications. You were treated in the emergency department today with intramuscular narcotic medication, as well as muscle relaxing medication. He's medications may cause drowsiness. Please use caution getting around today. Please see Dr. Sudie BaileyKnowlton next week to address the increasing pain of your lower back.

## 2016-09-03 ENCOUNTER — Emergency Department (HOSPITAL_COMMUNITY)
Admission: EM | Admit: 2016-09-03 | Discharge: 2016-09-04 | Disposition: A | Discharge status: Home / Self Care | Payer: Medicaid Other | Attending: Emergency Medicine | Admitting: Emergency Medicine

## 2016-09-03 ENCOUNTER — Encounter (HOSPITAL_COMMUNITY): Payer: Self-pay | Admitting: Emergency Medicine

## 2016-09-03 ENCOUNTER — Emergency Department (HOSPITAL_COMMUNITY): Payer: Medicaid Other

## 2016-09-03 DIAGNOSIS — Z7982 Long term (current) use of aspirin: Secondary | ICD-10-CM | POA: Insufficient documentation

## 2016-09-03 DIAGNOSIS — R05 Cough: Secondary | ICD-10-CM | POA: Insufficient documentation

## 2016-09-03 DIAGNOSIS — Z79899 Other long term (current) drug therapy: Secondary | ICD-10-CM | POA: Insufficient documentation

## 2016-09-03 DIAGNOSIS — R079 Chest pain, unspecified: Secondary | ICD-10-CM

## 2016-09-03 DIAGNOSIS — R0602 Shortness of breath: Secondary | ICD-10-CM | POA: Diagnosis not present

## 2016-09-03 DIAGNOSIS — R200 Anesthesia of skin: Secondary | ICD-10-CM | POA: Diagnosis not present

## 2016-09-03 DIAGNOSIS — Z87891 Personal history of nicotine dependence: Secondary | ICD-10-CM | POA: Insufficient documentation

## 2016-09-03 LAB — CBC
HCT: 44.5 % (ref 39.0–52.0)
HEMOGLOBIN: 14.8 g/dL (ref 13.0–17.0)
MCH: 30.1 pg (ref 26.0–34.0)
MCHC: 33.3 g/dL (ref 30.0–36.0)
MCV: 90.4 fL (ref 78.0–100.0)
PLATELETS: 191 10*3/uL (ref 150–400)
RBC: 4.92 MIL/uL (ref 4.22–5.81)
RDW: 13.7 % (ref 11.5–15.5)
WBC: 6.7 10*3/uL (ref 4.0–10.5)

## 2016-09-03 LAB — BASIC METABOLIC PANEL
ANION GAP: 6 (ref 5–15)
BUN: 9 mg/dL (ref 6–20)
CO2: 31 mmol/L (ref 22–32)
CREATININE: 1.36 mg/dL — AB (ref 0.61–1.24)
Calcium: 8.9 mg/dL (ref 8.9–10.3)
Chloride: 100 mmol/L — ABNORMAL LOW (ref 101–111)
GFR, EST NON AFRICAN AMERICAN: 56 mL/min — AB (ref >60)
Glucose, Bld: 84 mg/dL (ref 65–99)
Potassium: 3.2 mmol/L — ABNORMAL LOW (ref 3.5–5.1)
Sodium: 137 mmol/L (ref 135–145)

## 2016-09-03 LAB — TROPONIN I: Troponin I: <0.03 ng/mL (ref <0.03)

## 2016-09-03 MED ORDER — KETOROLAC TROMETHAMINE 30 MG/ML IJ SOLN
30.0000 mg | Freq: Once | INTRAMUSCULAR | Status: AC
  Administered 2016-09-04: 30 mg via INTRAVENOUS
  Filled 2016-09-03: qty 1

## 2016-09-03 NOTE — ED Triage Notes (Signed)
Pt c/o central chest pain that radiates to the right arm. Pt states he took hydrocondone, xanax and blood pressure med before arriving. Pt states over last two weeks has had face numbness, watery eyes, and blurry vision.

## 2016-09-03 NOTE — ED Notes (Signed)
Pt returned from xray,  

## 2016-09-03 NOTE — ED Notes (Signed)
Patient transported to X-ray 

## 2016-09-04 LAB — TROPONIN I

## 2016-09-04 LAB — URINALYSIS, ROUTINE W REFLEX MICROSCOPIC
Bilirubin Urine: NEGATIVE
Glucose, UA: NEGATIVE mg/dL
Hgb urine dipstick: NEGATIVE
Ketones, ur: NEGATIVE mg/dL
LEUKOCYTES UA: NEGATIVE
NITRITE: NEGATIVE
PROTEIN: NEGATIVE mg/dL
Specific Gravity, Urine: 1.01 (ref 1.005–1.030)
pH: 5 (ref 5.0–8.0)

## 2016-09-04 LAB — D-DIMER, QUANTITATIVE (NOT AT ARMC)

## 2016-09-04 MED ORDER — CYCLOBENZAPRINE HCL 5 MG PO TABS: 5.0000 mg | ORAL_TABLET | Freq: Three times a day (TID) | ORAL | 0 refills | Status: DC | PRN

## 2016-09-04 MED ORDER — GI COCKTAIL ~~LOC~~
30.0000 mL | Freq: Once | ORAL | Status: AC
  Administered 2016-09-04: 30 mL via ORAL
  Filled 2016-09-04: qty 30

## 2016-09-04 NOTE — Discharge Instructions (Signed)
Your tests tonight do not show evidence of a heart attack. Keep your appointment later this morning with the heart doctor and your neurosurgeon that you already have scheduled.

## 2016-09-04 NOTE — ED Notes (Signed)
Pt and family mbr updated on plan of care,
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
## 2016-09-09 ENCOUNTER — Other Ambulatory Visit (HOSPITAL_COMMUNITY): Payer: Self-pay | Admitting: Neurosurgery

## 2016-09-09 DIAGNOSIS — Z981 Arthrodesis status: Secondary | ICD-10-CM

## 2016-09-16 ENCOUNTER — Emergency Department (HOSPITAL_COMMUNITY)
Admission: EM | Admit: 2016-09-16 | Discharge: 2016-09-16 | Disposition: A | Discharge status: Home / Self Care | Payer: Medicaid Other | Attending: Emergency Medicine | Admitting: Emergency Medicine

## 2016-09-16 ENCOUNTER — Encounter (HOSPITAL_COMMUNITY): Payer: Self-pay | Admitting: Emergency Medicine

## 2016-09-16 ENCOUNTER — Emergency Department (HOSPITAL_COMMUNITY): Payer: Medicaid Other

## 2016-09-16 DIAGNOSIS — Z87891 Personal history of nicotine dependence: Secondary | ICD-10-CM | POA: Insufficient documentation

## 2016-09-16 DIAGNOSIS — Z79899 Other long term (current) drug therapy: Secondary | ICD-10-CM | POA: Diagnosis not present

## 2016-09-16 DIAGNOSIS — Z8673 Personal history of transient ischemic attack (TIA), and cerebral infarction without residual deficits: Secondary | ICD-10-CM | POA: Diagnosis not present

## 2016-09-16 DIAGNOSIS — Z7982 Long term (current) use of aspirin: Secondary | ICD-10-CM | POA: Diagnosis not present

## 2016-09-16 DIAGNOSIS — R079 Chest pain, unspecified: Secondary | ICD-10-CM | POA: Insufficient documentation

## 2016-09-16 LAB — LIPASE, BLOOD: LIPASE: 32 U/L (ref 11–51)

## 2016-09-16 LAB — COMPREHENSIVE METABOLIC PANEL
ALK PHOS: 55 U/L (ref 38–126)
ALT: 9 U/L — ABNORMAL LOW (ref 17–63)
ANION GAP: 5 (ref 5–15)
AST: 16 U/L (ref 15–41)
Albumin: 3.9 g/dL (ref 3.5–5.0)
BILIRUBIN TOTAL: 0.4 mg/dL (ref 0.3–1.2)
BUN: 9 mg/dL (ref 6–20)
CALCIUM: 8.3 mg/dL — AB (ref 8.9–10.3)
CO2: 28 mmol/L (ref 22–32)
Chloride: 102 mmol/L (ref 101–111)
Creatinine, Ser: 1.27 mg/dL — ABNORMAL HIGH (ref 0.61–1.24)
Glucose, Bld: 108 mg/dL — ABNORMAL HIGH (ref 65–99)
Potassium: 3.8 mmol/L (ref 3.5–5.1)
Sodium: 135 mmol/L (ref 135–145)
TOTAL PROTEIN: 6.4 g/dL — AB (ref 6.5–8.1)

## 2016-09-16 LAB — CBC
HEMATOCRIT: 41.8 % (ref 39.0–52.0)
Hemoglobin: 13.9 g/dL (ref 13.0–17.0)
MCH: 30.1 pg (ref 26.0–34.0)
MCHC: 33.3 g/dL (ref 30.0–36.0)
MCV: 90.5 fL (ref 78.0–100.0)
PLATELETS: 174 10*3/uL (ref 150–400)
RBC: 4.62 MIL/uL (ref 4.22–5.81)
RDW: 13.8 % (ref 11.5–15.5)
WBC: 5.9 10*3/uL (ref 4.0–10.5)

## 2016-09-16 LAB — I-STAT TROPONIN, ED: Troponin i, poc: 0 ng/mL (ref 0.00–0.08)

## 2016-09-16 MED ORDER — GI COCKTAIL ~~LOC~~
30.0000 mL | Freq: Once | ORAL | Status: AC
  Administered 2016-09-16: 30 mL via ORAL
  Filled 2016-09-16: qty 30

## 2016-09-16 MED ORDER — KETOROLAC TROMETHAMINE 30 MG/ML IJ SOLN: 15.0000 mg | Freq: Once | INTRAMUSCULAR | Status: DC

## 2016-09-16 MED ORDER — PANTOPRAZOLE SODIUM 20 MG PO TBEC: 20.0000 mg | DELAYED_RELEASE_TABLET | Freq: Every day | ORAL | 0 refills | Status: DC

## 2016-09-16 MED ORDER — KETOROLAC TROMETHAMINE 60 MG/2ML IM SOLN
60.0000 mg | Freq: Once | INTRAMUSCULAR | Status: AC
  Administered 2016-09-16: 60 mg via INTRAMUSCULAR
  Filled 2016-09-16: qty 2

## 2016-09-16 NOTE — ED Notes (Signed)
Pt ambulatory to waiting room. Pt verbalized understanding of discharge instructions.   
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
## 2016-09-16 NOTE — ED Triage Notes (Signed)
Pt states that he was woken up from sleep with chest pain. Pt states took 81mg  asprin at home.

## 2016-09-16 NOTE — ED Notes (Signed)
I Stat Troponin was 0.00 

## 2016-09-21 ENCOUNTER — Ambulatory Visit (HOSPITAL_COMMUNITY)
Admission: RE | Admit: 2016-09-21 | Discharge: 2016-09-21 | Disposition: A | Discharge status: Home / Self Care | Payer: Medicaid Other | Source: Ambulatory Visit | Attending: Neurosurgery | Admitting: Neurosurgery

## 2016-09-21 DIAGNOSIS — M1288 Other specific arthropathies, not elsewhere classified, other specified site: Secondary | ICD-10-CM | POA: Insufficient documentation

## 2016-09-21 DIAGNOSIS — Z981 Arthrodesis status: Secondary | ICD-10-CM | POA: Diagnosis not present

## 2016-09-21 DIAGNOSIS — M4802 Spinal stenosis, cervical region: Secondary | ICD-10-CM | POA: Diagnosis not present

## 2016-12-15 ENCOUNTER — Other Ambulatory Visit: Payer: Self-pay | Admitting: Neurosurgery

## 2016-12-15 DIAGNOSIS — M4722 Other spondylosis with radiculopathy, cervical region: Secondary | ICD-10-CM

## 2016-12-28 ENCOUNTER — Ambulatory Visit
Admission: RE | Admit: 2016-12-28 | Discharge: 2016-12-28 | Disposition: A | Discharge status: Home / Self Care | Payer: Medicaid Other | Source: Ambulatory Visit | Attending: Neurosurgery | Admitting: Neurosurgery

## 2016-12-28 DIAGNOSIS — M4722 Other spondylosis with radiculopathy, cervical region: Secondary | ICD-10-CM

## 2016-12-28 MED ORDER — IOPAMIDOL (ISOVUE-M 300) INJECTION 61%
1.0000 mL | Freq: Once | INTRAMUSCULAR | Status: AC | PRN
  Administered 2016-12-28: 1 mL via INTRA_ARTICULAR

## 2016-12-28 MED ORDER — TRIAMCINOLONE ACETONIDE 40 MG/ML IJ SUSP (RADIOLOGY)
40.0000 mg | Freq: Once | INTRAMUSCULAR | Status: AC
  Administered 2016-12-28: 40 mg via INTRA_ARTICULAR

## 2016-12-28 NOTE — Discharge Instructions (Signed)

## 2017-02-03 ENCOUNTER — Emergency Department (HOSPITAL_COMMUNITY)
Admission: EM | Admit: 2017-02-03 | Discharge: 2017-02-04 | Disposition: A | Discharge status: Home / Self Care | Payer: Medicaid Other | Attending: Emergency Medicine | Admitting: Emergency Medicine

## 2017-02-03 ENCOUNTER — Other Ambulatory Visit: Payer: Self-pay

## 2017-02-03 ENCOUNTER — Emergency Department (HOSPITAL_COMMUNITY): Payer: Medicaid Other

## 2017-02-03 DIAGNOSIS — B9789 Other viral agents as the cause of diseases classified elsewhere: Secondary | ICD-10-CM

## 2017-02-03 DIAGNOSIS — Z7982 Long term (current) use of aspirin: Secondary | ICD-10-CM | POA: Diagnosis not present

## 2017-02-03 DIAGNOSIS — Z79899 Other long term (current) drug therapy: Secondary | ICD-10-CM | POA: Insufficient documentation

## 2017-02-03 DIAGNOSIS — R0789 Other chest pain: Secondary | ICD-10-CM | POA: Diagnosis not present

## 2017-02-03 DIAGNOSIS — Z87891 Personal history of nicotine dependence: Secondary | ICD-10-CM | POA: Diagnosis not present

## 2017-02-03 DIAGNOSIS — J069 Acute upper respiratory infection, unspecified: Secondary | ICD-10-CM | POA: Diagnosis not present

## 2017-02-03 LAB — CBC
HCT: 43.3 % (ref 39.0–52.0)
HEMOGLOBIN: 14.4 g/dL (ref 13.0–17.0)
MCH: 30.8 pg (ref 26.0–34.0)
MCHC: 33.3 g/dL (ref 30.0–36.0)
MCV: 92.5 fL (ref 78.0–100.0)
Platelets: 193 10*3/uL (ref 150–400)
RBC: 4.68 MIL/uL (ref 4.22–5.81)
RDW: 13.6 % (ref 11.5–15.5)
WBC: 7.7 10*3/uL (ref 4.0–10.5)

## 2017-02-03 LAB — BASIC METABOLIC PANEL
ANION GAP: 9 (ref 5–15)
BUN: 7 mg/dL (ref 6–20)
CALCIUM: 9.1 mg/dL (ref 8.9–10.3)
CO2: 29 mmol/L (ref 22–32)
Chloride: 102 mmol/L (ref 101–111)
Creatinine, Ser: 1.2 mg/dL (ref 0.61–1.24)
GFR calc non Af Amer: >60 mL/min (ref >60)
Glucose, Bld: 98 mg/dL (ref 65–99)
Potassium: 3.7 mmol/L (ref 3.5–5.1)
Sodium: 140 mmol/L (ref 135–145)

## 2017-02-03 LAB — I-STAT TROPONIN, ED: TROPONIN I, POC: 0 ng/mL (ref 0.00–0.08)

## 2017-02-03 MED ORDER — IPRATROPIUM-ALBUTEROL 0.5-2.5 (3) MG/3ML IN SOLN
3.0000 mL | Freq: Once | RESPIRATORY_TRACT | Status: AC
  Administered 2017-02-04: 3 mL via RESPIRATORY_TRACT
  Filled 2017-02-03: qty 3

## 2017-02-03 MED ORDER — GUAIFENESIN ER 600 MG PO TB12
600.0000 mg | ORAL_TABLET | Freq: Once | ORAL | Status: AC
  Administered 2017-02-04: 600 mg via ORAL
  Filled 2017-02-03: qty 1

## 2017-02-03 NOTE — ED Triage Notes (Signed)
Pt from home via [facility]. C/o CP x4-5days, sts it's more of a congestion type of feeling. Rates it @ a 10/10. Admits to ETOH. Sts has SHOB, lightheaded & dizzy. Hx of HTN. Refused ASA & NTG, claims to be allergic due to hx of ulcers & diverticulitis. 132/86 98% RA 121 CBG. Allergic to ASA & NTG ulcers & diverticulitis. A&O x4.

## 2017-02-03 NOTE — ED Provider Notes (Signed)
TIME SEEN: 11:22 PM  CHIEF COMPLAINT: Chest pain, congestion  HPI: Patient is a 57 year old male with history of degenerative disc disease who presents to the emergency department with complaints of chest pain.  Describes it as anterior chest pain that he states feels like a fullness and a pressure.  He states he feels congestion and he has had dry cough and subjective fevers.  Reports he feels like if he could just cough something up he would feel better.  States it is making him feel short of breath.  No nausea or vomiting.  No diaphoresis.  Has chronic dizziness which is unchanged.  No known sick contacts.  He does not smoke but does chew tobacco.  States pain is been present for 4-5 days and is constant.  Not exertional or pleuritic.  ROS: See HPI Constitutional: no fever  Eyes: no drainage  ENT: no runny nose   Cardiovascular:   chest pain  Resp:  SOB  GI: no vomiting GU: no dysuria Integumentary: no rash  Allergy: no hives  Musculoskeletal: no leg swelling  Neurological: no slurred speech ROS otherwise negative  PAST MEDICAL HISTORY/PAST SURGICAL HISTORY:  Past Medical History:  Diagnosis Date  . Anxiety   . Degenerative brain disorder   . Degenerative disc disease   . Degenerative joint disease   . Liver disease   . Liver mass    sts. he declined biopsy  . Shortness of breath dyspnea    with exertion  . Vision abnormalities     MEDICATIONS:  Prior to Admission medications   Medication Sig Start Date End Date Taking? Authorizing Provider  alprazolam Prudy Feeler(XANAX) 2 MG tablet Take 2 mg by mouth 4 (four) times daily.      [provider]  aspirin 325 MG tablet Take 1 tablet (325 mg total) by mouth daily. 08/25/13   Wilson SingerGosrani, Nimish C, MD  atorvastatin (LIPITOR) 20 MG tablet Take 20 mg by mouth daily.    [provider]  cyclobenzaprine (FLEXERIL) 5 MG tablet Take 1 tablet (5 mg total) by mouth 3 (three) times daily as needed. 09/04/16   Devoria AlbeKnapp, Iva, MD   HYDROcodone-acetaminophen (NORCO) 10-325 MG per tablet Take 1-2 tablets by mouth every 4 (four) hours as needed for moderate pain. For pain    [provider]  lisinopril (PRINIVIL,ZESTRIL) 20 MG tablet Take 20 mg by mouth daily.    [provider]  pantoprazole (PROTONIX) 20 MG tablet Take 1 tablet (20 mg total) by mouth daily. 09/16/16   Gilda CreasePollina, Christopher J, MD  senna (SENOKOT) 8.6 MG TABS tablet Take 1 tablet by mouth daily.    [provider]    ALLERGIES:  Allergies  Allergen Reactions  . Risperidone And Related Other (See Comments)    Pt states "I go Crazy"  . Asa [Aspirin] Other (See Comments)    History of stomach issues with 325mg , currently takes 81mg  daily.    Marland Kitchen. Penicillins Itching and Rash    SOCIAL HISTORY:  Social History   Tobacco Use  . Smoking status: Former Smoker    Types: Cigarettes    Last attempt to quit: 11/15/2006    Years since quitting: 10.2  . Smokeless tobacco: Former NeurosurgeonUser    Types: Chew  . Tobacco comment: quit 7-8 yrs. He however does chew tobacco.  Substance Use Topics  . Alcohol use: No    FAMILY HISTORY: No family history on file.  EXAM: BP (!) 142/101 (BP Location: Right Arm)  Pulse 75   Temp 98.4 F (36.9 C) (Oral)   Resp 15   Ht 6' (1.829 m)   Wt 77.1 kg (170 lb)   SpO2 100%   BMI 23.06 kg/m  CONSTITUTIONAL: Alert and oriented and responds appropriately to questions. Well-appearing; well-nourished HEAD: Normocephalic EYES: Conjunctivae clear, pupils appear equal, EOMI ENT: normal nose; moist mucous membranes NECK: Supple, no meningismus, no nuchal rigidity, no LAD  CARD: RRR; S1 and S2 appreciated; no murmurs, no clicks, no rubs, no gallops RESP: Normal chest excursion without splinting or tachypnea; breath sounds clear and equal bilaterally; no wheezes, no rhonchi, no rales, no hypoxia or respiratory distress, speaking full sentences, when patient coughs intermittently patient does have some  expiratory wheezes appreciated ABD/GI: Normal bowel sounds; non-distended; soft, non-tender, no rebound, no guarding, no peritoneal signs, no hepatosplenomegaly BACK:  The back appears normal and is non-tender to palpation, there is no CVA tenderness EXT: Normal ROM in all joints; non-tender to palpation; no edema; normal capillary refill; no cyanosis, no calf tenderness or swelling    SKIN: Normal color for age and race; warm; no rash NEURO: Moves all extremities equally PSYCH: The patient's mood and manner are appropriate. Grooming and personal hygiene are appropriate.  MEDICAL DECISION MAKING: Patient here with likely viral URI causing symptoms.  Low suspicion for ACS, PE or dissection.  Pain constant for several days and he has a negative troponin.  I do not feel he needs serial cardiac enzymes at this time.  His chest x-ray is clear.  EKG shows no ischemic abnormality.  We will treat symptomatically with DuoNeb, guaifenesin and reassess.  He is well-appearing here with no hypoxia, respiratory distress or increased work of breathing.  No known history of asthma or COPD.  I do not feel at this time he would benefit from steroids.  He does not appear septic.  ED PROGRESS: Patient given 2 DuoNeb treatments and reports feeling much better.  Feels like he is now able to have a productive cough.  His chest pain has resolved.  Will discharge with albuterol inhaler and prescription of Mucinex.  Discussed return precautions.  I do not feel he needs antibiotics.  He has a PCP for follow-up.   At this time, I do not feel there is any life-threatening condition present. I have reviewed and discussed all results (EKG, imaging, lab, urine as appropriate) and exam findings with patient/family. I have reviewed nursing notes and appropriate previous records.  I feel the patient is safe to be discharged home without further emergent workup and can continue workup as an outpatient as needed. Discussed usual and  customary return precautions. Patient/family verbalize understanding and are comfortable with this plan.  Outpatient follow-up has been provided if needed. All questions have been answered.     EKG Interpretation  Date/Time:  Wednesday February 03 2017 23:16:18 EST Ventricular Rate:  66 PR Interval:    QRS Duration: 89 QT Interval:  392 QTC Calculation: 411 R Axis:   74 Text Interpretation:  Sinus rhythm ST elevation, consider inferior injury Baseline wander in lead(s) II III aVR aVF V1 V5 V6 No significant change since last tracing Confirmed by Rochele Raring [phone]) on 02/03/2017 11:22:39 PM         Natoshia Souter, Layla Maw, DO 02/04/17 6045

## 2017-02-04 MED ORDER — ALBUTEROL SULFATE HFA 108 (90 BASE) MCG/ACT IN AERS
2.0000 | INHALATION_SPRAY | Freq: Once | RESPIRATORY_TRACT | Status: AC
  Administered 2017-02-04: 2 via RESPIRATORY_TRACT
  Filled 2017-02-04: qty 6.7

## 2017-02-04 MED ORDER — GUAIFENESIN ER 600 MG PO TB12: 600.0000 mg | ORAL_TABLET | Freq: Two times a day (BID) | ORAL | 0 refills | Status: DC

## 2017-02-04 MED ORDER — IPRATROPIUM-ALBUTEROL 0.5-2.5 (3) MG/3ML IN SOLN
3.0000 mL | Freq: Once | RESPIRATORY_TRACT | Status: AC
  Administered 2017-02-04: 3 mL via RESPIRATORY_TRACT
  Filled 2017-02-04: qty 3

## 2017-02-04 NOTE — Discharge Instructions (Addendum)
You may use your albuterol 2-4 puffs every 2-4 hours as needed for shortness of breath, wheezing.

## 2017-02-11 ENCOUNTER — Emergency Department (HOSPITAL_COMMUNITY)
Admission: EM | Admit: 2017-02-11 | Discharge: 2017-02-12 | Disposition: A | Discharge status: Home / Self Care | Payer: Medicaid Other | Attending: Emergency Medicine | Admitting: Emergency Medicine

## 2017-02-11 ENCOUNTER — Encounter (HOSPITAL_COMMUNITY): Payer: Self-pay | Admitting: Emergency Medicine

## 2017-02-11 ENCOUNTER — Other Ambulatory Visit: Payer: Self-pay

## 2017-02-11 DIAGNOSIS — J069 Acute upper respiratory infection, unspecified: Secondary | ICD-10-CM | POA: Diagnosis not present

## 2017-02-11 DIAGNOSIS — Z79899 Other long term (current) drug therapy: Secondary | ICD-10-CM | POA: Insufficient documentation

## 2017-02-11 DIAGNOSIS — Z7982 Long term (current) use of aspirin: Secondary | ICD-10-CM | POA: Insufficient documentation

## 2017-02-11 DIAGNOSIS — Z87891 Personal history of nicotine dependence: Secondary | ICD-10-CM | POA: Diagnosis not present

## 2017-02-11 DIAGNOSIS — Z8673 Personal history of transient ischemic attack (TIA), and cerebral infarction without residual deficits: Secondary | ICD-10-CM | POA: Diagnosis not present

## 2017-02-11 DIAGNOSIS — R05 Cough: Secondary | ICD-10-CM | POA: Diagnosis present

## 2017-02-11 MED ORDER — METHYLPREDNISOLONE SODIUM SUCC 125 MG IJ SOLR
125.0000 mg | Freq: Once | INTRAMUSCULAR | Status: AC
  Administered 2017-02-12: 125 mg via INTRAVENOUS
  Filled 2017-02-11: qty 2

## 2017-02-11 MED ORDER — HYDROCODONE-HOMATROPINE 5-1.5 MG/5ML PO SYRP
5.0000 mL | ORAL_SOLUTION | Freq: Once | ORAL | Status: AC
  Administered 2017-02-12: 5 mL via ORAL
  Filled 2017-02-11: qty 5

## 2017-02-11 NOTE — ED Triage Notes (Signed)
Patient reports continued cough, chills and weakness since he was [facility].

## 2017-02-11 NOTE — ED Triage Notes (Signed)
Delay explained to patient. VSS. NAD noted.

## 2017-02-12 ENCOUNTER — Emergency Department (HOSPITAL_COMMUNITY): Payer: Medicaid Other

## 2017-02-12 LAB — INFLUENZA PANEL BY PCR (TYPE A & B)
INFLAPCR: NEGATIVE
Influenza B By PCR: NEGATIVE

## 2017-02-12 MED ORDER — IPRATROPIUM-ALBUTEROL 0.5-2.5 (3) MG/3ML IN SOLN
3.0000 mL | Freq: Once | RESPIRATORY_TRACT | Status: AC
  Administered 2017-02-12: 3 mL via RESPIRATORY_TRACT
  Filled 2017-02-12: qty 3

## 2017-02-12 MED ORDER — SODIUM CHLORIDE 0.9 % IV BOLUS (SEPSIS)
500.0000 mL | Freq: Once | INTRAVENOUS | Status: AC
  Administered 2017-02-12: 500 mL via INTRAVENOUS

## 2017-02-12 NOTE — Discharge Instructions (Signed)
Your vital signs within normal limits.  Your pulse oximetry has been between 98 and 100% throughout your hospitalization.  Recheck of your chest x-ray is negative for acute changes.  Your influenza test is negative for both influenza A and influenza B.  You have been given IV fluids tonight.  You may continue your current medications.  Please see Dr. Sudie BaileyKnowlton for follow-up of your symptoms, and management of your other medical conditions.
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
## 2017-03-04 ENCOUNTER — Emergency Department (HOSPITAL_COMMUNITY)
Admission: EM | Admit: 2017-03-04 | Discharge: 2017-03-04 | Disposition: A | Discharge status: Home / Self Care | Payer: Medicaid Other | Attending: Emergency Medicine | Admitting: Emergency Medicine

## 2017-03-04 ENCOUNTER — Emergency Department (HOSPITAL_COMMUNITY): Payer: Medicaid Other

## 2017-03-04 ENCOUNTER — Encounter (HOSPITAL_COMMUNITY): Payer: Self-pay | Admitting: Emergency Medicine

## 2017-03-04 DIAGNOSIS — Z79899 Other long term (current) drug therapy: Secondary | ICD-10-CM | POA: Insufficient documentation

## 2017-03-04 DIAGNOSIS — R05 Cough: Secondary | ICD-10-CM | POA: Diagnosis not present

## 2017-03-04 DIAGNOSIS — F17220 Nicotine dependence, chewing tobacco, uncomplicated: Secondary | ICD-10-CM | POA: Insufficient documentation

## 2017-03-04 DIAGNOSIS — R059 Cough, unspecified: Secondary | ICD-10-CM

## 2017-03-04 LAB — BASIC METABOLIC PANEL
ANION GAP: 9 (ref 5–15)
BUN: 7 mg/dL (ref 6–20)
CHLORIDE: 101 mmol/L (ref 101–111)
CO2: 29 mmol/L (ref 22–32)
CREATININE: 1 mg/dL (ref 0.61–1.24)
Calcium: 8.8 mg/dL — ABNORMAL LOW (ref 8.9–10.3)
GFR calc non Af Amer: >60 mL/min (ref >60)
Glucose, Bld: 85 mg/dL (ref 65–99)
POTASSIUM: 4 mmol/L (ref 3.5–5.1)
SODIUM: 139 mmol/L (ref 135–145)

## 2017-03-04 LAB — CBC WITH DIFFERENTIAL/PLATELET
BASOS PCT: 0 %
Basophils Absolute: 0 10*3/uL (ref 0.0–0.1)
Eosinophils Absolute: 0.1 10*3/uL (ref 0.0–0.7)
Eosinophils Relative: 3 %
HEMATOCRIT: 42.1 % (ref 39.0–52.0)
HEMOGLOBIN: 13.2 g/dL (ref 13.0–17.0)
Lymphocytes Relative: 17 %
Lymphs Abs: 0.9 10*3/uL (ref 0.7–4.0)
MCH: 29.5 pg (ref 26.0–34.0)
MCHC: 31.4 g/dL (ref 30.0–36.0)
MCV: 94 fL (ref 78.0–100.0)
MONOS PCT: 14 %
Monocytes Absolute: 0.7 10*3/uL (ref 0.1–1.0)
NEUTROS PCT: 66 %
Neutro Abs: 3.5 10*3/uL (ref 1.7–7.7)
Platelets: 183 10*3/uL (ref 150–400)
RBC: 4.48 MIL/uL (ref 4.22–5.81)
RDW: 14.3 % (ref 11.5–15.5)
WBC: 5.3 10*3/uL (ref 4.0–10.5)

## 2017-03-04 LAB — HEPATIC FUNCTION PANEL
ALBUMIN: 4 g/dL (ref 3.5–5.0)
ALK PHOS: 70 U/L (ref 38–126)
ALT: 18 U/L (ref 17–63)
AST: 25 U/L (ref 15–41)
Bilirubin, Direct: 0.1 mg/dL (ref 0.1–0.5)
Indirect Bilirubin: 0.5 mg/dL (ref 0.3–0.9)
TOTAL PROTEIN: 7.1 g/dL (ref 6.5–8.1)
Total Bilirubin: 0.6 mg/dL (ref 0.3–1.2)

## 2017-03-04 LAB — TROPONIN I: Troponin I: <0.03 ng/mL (ref <0.03)

## 2017-03-04 LAB — D-DIMER, QUANTITATIVE: D-Dimer, Quant: <0.27 ug/mL-FEU (ref 0.00–0.50)

## 2017-03-04 MED ORDER — HYDROCODONE-HOMATROPINE 5-1.5 MG/5ML PO SYRP: 5.0000 mL | ORAL_SOLUTION | Freq: Four times a day (QID) | ORAL | 0 refills | Status: DC | PRN

## 2017-03-04 MED ORDER — DOXYCYCLINE HYCLATE 100 MG PO CAPS: 100.0000 mg | ORAL_CAPSULE | Freq: Two times a day (BID) | ORAL | 0 refills | Status: DC

## 2017-03-04 MED ORDER — HYDROCODONE-ACETAMINOPHEN 5-325 MG PO TABS
2.0000 | ORAL_TABLET | Freq: Once | ORAL | Status: AC
  Administered 2017-03-04: 2 via ORAL
  Filled 2017-03-04: qty 2

## 2017-03-04 MED ORDER — PREDNISONE 10 MG PO TABS: 20.0000 mg | ORAL_TABLET | Freq: Every day | ORAL | 0 refills | Status: DC

## 2017-03-04 NOTE — ED Triage Notes (Signed)
Patient complaining of non-productive cough x chest pain x 3-4 weeks.

## 2017-03-04 NOTE — Discharge Instructions (Signed)
Follow-up with your family doctor next week for recheck drink plenty of fluids ?

## 2017-03-04 NOTE — ED Notes (Signed)
PO fluids given
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
## 2017-03-16 ENCOUNTER — Ambulatory Visit (HOSPITAL_COMMUNITY)
Admission: RE | Admit: 2017-03-16 | Discharge: 2017-03-16 | Disposition: A | Discharge status: Home / Self Care | Payer: Medicaid Other | Source: Ambulatory Visit | Attending: Family Medicine | Admitting: Family Medicine

## 2017-03-16 ENCOUNTER — Other Ambulatory Visit (HOSPITAL_COMMUNITY): Payer: Self-pay | Admitting: Family Medicine

## 2017-03-16 DIAGNOSIS — M4802 Spinal stenosis, cervical region: Secondary | ICD-10-CM | POA: Diagnosis not present

## 2017-03-16 DIAGNOSIS — Z981 Arthrodesis status: Secondary | ICD-10-CM | POA: Diagnosis not present

## 2017-03-16 DIAGNOSIS — M542 Cervicalgia: Secondary | ICD-10-CM

## 2017-03-16 DIAGNOSIS — M4722 Other spondylosis with radiculopathy, cervical region: Secondary | ICD-10-CM | POA: Diagnosis present

## 2017-03-18 ENCOUNTER — Other Ambulatory Visit: Payer: Self-pay

## 2017-03-18 ENCOUNTER — Emergency Department (HOSPITAL_COMMUNITY)
Admission: EM | Admit: 2017-03-18 | Discharge: 2017-03-18 | Disposition: A | Discharge status: Home / Self Care | Payer: Medicaid Other | Attending: Emergency Medicine | Admitting: Emergency Medicine

## 2017-03-18 ENCOUNTER — Other Ambulatory Visit: Payer: Self-pay | Admitting: Neurosurgery

## 2017-03-18 ENCOUNTER — Emergency Department (HOSPITAL_COMMUNITY): Payer: Medicaid Other

## 2017-03-18 ENCOUNTER — Encounter (HOSPITAL_COMMUNITY): Payer: Self-pay | Admitting: Emergency Medicine

## 2017-03-18 DIAGNOSIS — Z79899 Other long term (current) drug therapy: Secondary | ICD-10-CM | POA: Diagnosis not present

## 2017-03-18 DIAGNOSIS — M4722 Other spondylosis with radiculopathy, cervical region: Secondary | ICD-10-CM

## 2017-03-18 DIAGNOSIS — M79661 Pain in right lower leg: Secondary | ICD-10-CM | POA: Diagnosis present

## 2017-03-18 DIAGNOSIS — M62831 Muscle spasm of calf: Secondary | ICD-10-CM | POA: Insufficient documentation

## 2017-03-18 DIAGNOSIS — Z87891 Personal history of nicotine dependence: Secondary | ICD-10-CM | POA: Insufficient documentation

## 2017-03-18 DIAGNOSIS — M62838 Other muscle spasm: Secondary | ICD-10-CM

## 2017-03-18 MED ORDER — METHOCARBAMOL 500 MG PO TABS: 500.0000 mg | ORAL_TABLET | Freq: Three times a day (TID) | ORAL | 0 refills | Status: DC

## 2017-03-18 NOTE — Discharge Instructions (Signed)
Your ultrasound today was negative for a blood clot to your leg.  Alternate with ice and heat to your leg.  Follow-up with your primary doctor for recheck
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
## 2017-03-18 NOTE — ED Triage Notes (Signed)
Patient brought in via EMS from home. Alert and oriented. Airway patent. No acute distress noted. Patient states that right leg pain. Per patient pain radiates from toes to hip. Patient states dull pain, took hydrocodone an hour ago with no relief. No swelling. CNS intact. Per EMS patient ambulated to truck.

## 2017-03-23 ENCOUNTER — Ambulatory Visit (HOSPITAL_COMMUNITY)
Admission: RE | Admit: 2017-03-23 | Discharge: 2017-03-23 | Disposition: A | Discharge status: Home / Self Care | Payer: Medicaid Other | Source: Ambulatory Visit | Attending: Family Medicine | Admitting: Family Medicine

## 2017-03-23 ENCOUNTER — Other Ambulatory Visit (HOSPITAL_COMMUNITY): Payer: Self-pay | Admitting: Family Medicine

## 2017-03-23 DIAGNOSIS — M5441 Lumbago with sciatica, right side: Secondary | ICD-10-CM | POA: Diagnosis not present

## 2017-04-01 ENCOUNTER — Ambulatory Visit
Admission: RE | Admit: 2017-04-01 | Discharge: 2017-04-01 | Disposition: A | Discharge status: Home / Self Care | Payer: Medicaid Other | Source: Ambulatory Visit | Attending: Neurosurgery | Admitting: Neurosurgery

## 2017-04-01 ENCOUNTER — Other Ambulatory Visit: Payer: Self-pay | Admitting: Neurosurgery

## 2017-04-01 DIAGNOSIS — M4722 Other spondylosis with radiculopathy, cervical region: Secondary | ICD-10-CM

## 2017-04-01 MED ORDER — DEXAMETHASONE SODIUM PHOSPHATE 4 MG/ML IJ SOLN
8.0000 mg | Freq: Once | INTRAMUSCULAR | Status: AC
  Administered 2017-04-01: 8 mg via INTRA_ARTICULAR

## 2017-04-01 MED ORDER — IOPAMIDOL (ISOVUE-M 300) INJECTION 61%
1.0000 mL | Freq: Once | INTRAMUSCULAR | Status: AC | PRN
  Administered 2017-04-01: 1 mL via INTRA_ARTICULAR

## 2017-04-01 NOTE — Discharge Instructions (Signed)

## 2017-04-18 ENCOUNTER — Emergency Department (HOSPITAL_COMMUNITY)
Admission: EM | Admit: 2017-04-18 | Discharge: 2017-04-18 | Disposition: A | Discharge status: Home / Self Care | Payer: Medicaid Other | Attending: Emergency Medicine | Admitting: Emergency Medicine

## 2017-04-18 ENCOUNTER — Emergency Department (HOSPITAL_COMMUNITY): Payer: Medicaid Other

## 2017-04-18 ENCOUNTER — Encounter (HOSPITAL_COMMUNITY): Payer: Self-pay | Admitting: *Deleted

## 2017-04-18 ENCOUNTER — Other Ambulatory Visit: Payer: Self-pay

## 2017-04-18 DIAGNOSIS — Z79899 Other long term (current) drug therapy: Secondary | ICD-10-CM | POA: Insufficient documentation

## 2017-04-18 DIAGNOSIS — G8929 Other chronic pain: Secondary | ICD-10-CM | POA: Insufficient documentation

## 2017-04-18 DIAGNOSIS — M5441 Lumbago with sciatica, right side: Secondary | ICD-10-CM | POA: Insufficient documentation

## 2017-04-18 DIAGNOSIS — M25551 Pain in right hip: Secondary | ICD-10-CM | POA: Diagnosis present

## 2017-04-18 DIAGNOSIS — Z87891 Personal history of nicotine dependence: Secondary | ICD-10-CM | POA: Insufficient documentation

## 2017-04-18 MED ORDER — HYDROCODONE-ACETAMINOPHEN 5-325 MG PO TABS
2.0000 | ORAL_TABLET | Freq: Once | ORAL | Status: AC
  Administered 2017-04-18: 2 via ORAL
  Filled 2017-04-18: qty 2

## 2017-04-18 MED ORDER — LIDOCAINE 5 % EX PTCH: 1.0000 | MEDICATED_PATCH | CUTANEOUS | 0 refills | Status: DC

## 2017-04-18 MED ORDER — KETOROLAC TROMETHAMINE 30 MG/ML IJ SOLN
30.0000 mg | Freq: Once | INTRAMUSCULAR | Status: DC
  Filled 2017-04-18: qty 1

## 2017-04-18 NOTE — ED Notes (Signed)
Pt has a cane for use to ambulate with.

## 2017-04-18 NOTE — ED Triage Notes (Signed)
Pt with right hip pain ongoing Thursday morning, pt with same pain but pt states is worse, pt admits to taking a muscle relaxer and hydrocodone PTA.
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
## 2017-04-18 NOTE — Discharge Instructions (Addendum)
Try the pain patch prescribed in addition to your other home medicines. Take your next dose of prednisone tomorrow morning.  Use the the other medicines as directed.    Avoid lifting,  Bending,  Twisting or any other activity that worsens your pain over the next week.   You should get rechecked if your symptoms are not better over the next 5 days,  Or you develop increased pain,  Weakness in your leg(s) or loss of bladder or bowel function - these are symptoms of a worse injury.

## 2017-06-08 ENCOUNTER — Other Ambulatory Visit (HOSPITAL_COMMUNITY): Payer: Self-pay | Admitting: Family Medicine

## 2017-06-08 DIAGNOSIS — M5441 Lumbago with sciatica, right side: Secondary | ICD-10-CM

## 2017-06-15 ENCOUNTER — Ambulatory Visit (HOSPITAL_COMMUNITY)
Admission: RE | Admit: 2017-06-15 | Discharge: 2017-06-15 | Disposition: A | Discharge status: Home / Self Care | Payer: Medicaid Other | Source: Ambulatory Visit | Attending: Family Medicine | Admitting: Family Medicine

## 2017-06-15 DIAGNOSIS — M5441 Lumbago with sciatica, right side: Secondary | ICD-10-CM

## 2017-06-15 DIAGNOSIS — M545 Low back pain: Secondary | ICD-10-CM | POA: Insufficient documentation

## 2017-06-15 DIAGNOSIS — M47816 Spondylosis without myelopathy or radiculopathy, lumbar region: Secondary | ICD-10-CM | POA: Insufficient documentation

## 2017-07-22 ENCOUNTER — Ambulatory Visit (INDEPENDENT_AMBULATORY_CARE_PROVIDER_SITE_OTHER): Payer: Medicaid Other | Admitting: Internal Medicine

## 2017-08-09 ENCOUNTER — Encounter (INDEPENDENT_AMBULATORY_CARE_PROVIDER_SITE_OTHER): Payer: Self-pay | Admitting: Internal Medicine

## 2017-08-09 ENCOUNTER — Ambulatory Visit (INDEPENDENT_AMBULATORY_CARE_PROVIDER_SITE_OTHER): Payer: Medicaid Other | Admitting: Internal Medicine

## 2017-08-13 ENCOUNTER — Other Ambulatory Visit: Payer: Self-pay | Admitting: Family Medicine

## 2017-08-13 DIAGNOSIS — R799 Abnormal finding of blood chemistry, unspecified: Secondary | ICD-10-CM

## 2017-08-19 ENCOUNTER — Ambulatory Visit (HOSPITAL_COMMUNITY): Admission: RE | Admit: 2017-08-19 | Payer: Medicaid Other | Source: Ambulatory Visit

## 2017-09-09 ENCOUNTER — Ambulatory Visit (HOSPITAL_COMMUNITY)
Admission: RE | Admit: 2017-09-09 | Discharge: 2017-09-09 | Disposition: A | Discharge status: Home / Self Care | Payer: Medicaid Other | Source: Ambulatory Visit | Attending: Family Medicine | Admitting: Family Medicine

## 2017-09-09 DIAGNOSIS — R799 Abnormal finding of blood chemistry, unspecified: Secondary | ICD-10-CM | POA: Diagnosis not present

## 2017-10-26 ENCOUNTER — Other Ambulatory Visit: Payer: Self-pay

## 2017-10-26 ENCOUNTER — Encounter (HOSPITAL_COMMUNITY): Payer: Self-pay

## 2017-10-26 ENCOUNTER — Emergency Department (HOSPITAL_COMMUNITY): Payer: Medicaid Other

## 2017-10-26 ENCOUNTER — Emergency Department (HOSPITAL_COMMUNITY)
Admission: EM | Admit: 2017-10-26 | Discharge: 2017-10-26 | Disposition: A | Discharge status: Home / Self Care | Payer: Medicaid Other | Attending: Emergency Medicine | Admitting: Emergency Medicine

## 2017-10-26 DIAGNOSIS — W19XXXA Unspecified fall, initial encounter: Secondary | ICD-10-CM

## 2017-10-26 DIAGNOSIS — Y999 Unspecified external cause status: Secondary | ICD-10-CM | POA: Diagnosis not present

## 2017-10-26 DIAGNOSIS — S20212A Contusion of left front wall of thorax, initial encounter: Secondary | ICD-10-CM | POA: Insufficient documentation

## 2017-10-26 DIAGNOSIS — Z8673 Personal history of transient ischemic attack (TIA), and cerebral infarction without residual deficits: Secondary | ICD-10-CM | POA: Insufficient documentation

## 2017-10-26 DIAGNOSIS — Y929 Unspecified place or not applicable: Secondary | ICD-10-CM | POA: Insufficient documentation

## 2017-10-26 DIAGNOSIS — Z87891 Personal history of nicotine dependence: Secondary | ICD-10-CM | POA: Diagnosis not present

## 2017-10-26 DIAGNOSIS — W010XXA Fall on same level from slipping, tripping and stumbling without subsequent striking against object, initial encounter: Secondary | ICD-10-CM | POA: Insufficient documentation

## 2017-10-26 DIAGNOSIS — S20302A Unspecified superficial injuries of left front wall of thorax, initial encounter: Secondary | ICD-10-CM | POA: Diagnosis present

## 2017-10-26 DIAGNOSIS — M25552 Pain in left hip: Secondary | ICD-10-CM | POA: Diagnosis not present

## 2017-10-26 DIAGNOSIS — Y9389 Activity, other specified: Secondary | ICD-10-CM | POA: Insufficient documentation

## 2017-10-26 MED ORDER — LIDOCAINE 5 % EX PTCH: 1.0000 | MEDICATED_PATCH | CUTANEOUS | 0 refills | Status: AC

## 2017-10-26 NOTE — Discharge Instructions (Signed)
Your workup today showed that you may have a fracture to one or more ribs.  Unfortunately this type of injury hurts but there is no way to fix it immediately; it must heal over time.  Be sure to take plenty of deep breaths so that you get rid of the "bad air" in your lungs.  If you are given a device called an incentive spirometer, please use it as recommended.  Unless you have been told by your doctor not to do so, we recommend you take ibuprofen 600 mg 3 times daily with meals for no more than 5 days.  You can also take Tylenol 1000 mg every 6 hours for pain.  Follow-up at the clinics or with the doctors described in this paperwork.  Return to the emergency department if he develop new or worsening symptoms that concern you.   Rib Fracture A rib fracture is a break or crack in one of the bones of the ribs. The ribs are a group of [name], curved bones that wrap around your chest and attach to your spine. They protect your lungs and other organs in the chest cavity. A broken or cracked rib is often painful, but most do not cause other problems. Most rib fractures heal on their own over time. However, rib fractures can be more serious if multiple ribs are broken or if broken ribs move out of place and push against other structures. CAUSES  A direct blow to the chest. For example, this could happen during contact sports, a car accident, or a fall against a hard object. Repetitive movements with high force, such as pitching a baseball or having severe coughing spells. SYMPTOMS  Pain when you breathe in or cough. Pain when someone presses on the injured area. DIAGNOSIS  Your caregiver will perform a physical exam. Various imaging tests may be ordered to confirm the diagnosis and to look for related injuries. These tests may include a chest X-ray, computed tomography (CT), magnetic resonance imaging (MRI), or a bone scan. TREATMENT  Rib fractures usually heal on their own in 1-3 months. The longer healing  period is often associated with a continued cough or other aggravating activities. During the healing period, pain control is very important. Medication is usually given to control pain. Hospitalization or surgery may be needed for more severe injuries, such as those in which multiple ribs are broken or the ribs have moved out of place.  HOME CARE INSTRUCTIONS  Avoid strenuous activity and any activities or movements that cause pain. Be careful during activities and avoid bumping the injured rib. Gradually increase activity as directed by your caregiver. Only take over-the-counter or prescription medications as directed by your caregiver. Do not take other medications without asking your caregiver first. Apply ice to the injured area for the first 1-2 days after you have been treated or as directed by your caregiver. Applying ice helps to reduce inflammation and pain. Put ice in a plastic bag. Place a towel between your skin and the bag.   Leave the ice on for 15-20 minutes at a time, every 2 hours while you are awake. Perform deep breathing as directed by your caregiver. This will help prevent pneumonia, which is a common complication of a broken rib. Your caregiver may instruct you to: Take deep breaths several times a day. Try to cough several times a day, holding a pillow against the injured area. Use a device called an incentive spirometer to practice deep breathing several times a day.  Drink enough fluids to keep your urine clear or pale yellow. This will help you avoid constipation.   Do not wear a rib belt or binder. These restrict breathing, which can lead to pneumonia.   SEEK IMMEDIATE MEDICAL CARE IF:  You have a fever.   You have difficulty breathing or shortness of breath.   You develop a continual cough, or you cough up thick or bloody sputum. You feel sick to your stomach (nausea), throw up (vomit), or have abdominal pain.   You have worsening pain not controlled with medications.    MAKE SURE YOU: Understand these instructions. Will watch your condition. Will get help right away if you are not doing well or get worse. Document Released: 12/29/2004 Document Revised: 08/31/2012 Document Reviewed: 03/02/2012 [facility] Patient Information 2015 [place], [place]. This information is not intended to replace advice given to you by your health care provider. Make sure you discuss any questions you have with your health care provider.

## 2017-10-26 NOTE — ED Triage Notes (Signed)
Saturday morning, pt fell and hit left side of body. Pt is afraid he has fractured ribs. When he inhales he is having  pain. Also complaining of left side of shoulder  And back pain.
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
## 2017-11-01 ENCOUNTER — Other Ambulatory Visit (HOSPITAL_COMMUNITY): Payer: Self-pay | Admitting: Nurse Practitioner

## 2017-11-01 ENCOUNTER — Ambulatory Visit (HOSPITAL_COMMUNITY)
Admission: RE | Admit: 2017-11-01 | Discharge: 2017-11-01 | Disposition: A | Discharge status: Home / Self Care | Payer: Medicaid Other | Source: Ambulatory Visit | Attending: Nurse Practitioner | Admitting: Nurse Practitioner

## 2017-11-01 DIAGNOSIS — S20219S Contusion of unspecified front wall of thorax, sequela: Secondary | ICD-10-CM | POA: Insufficient documentation

## 2017-11-01 DIAGNOSIS — S20211A Contusion of right front wall of thorax, initial encounter: Secondary | ICD-10-CM

## 2017-11-01 DIAGNOSIS — X58XXXS Exposure to other specified factors, sequela: Secondary | ICD-10-CM | POA: Diagnosis not present

## 2017-11-22 ENCOUNTER — Other Ambulatory Visit: Payer: Self-pay

## 2017-11-22 ENCOUNTER — Emergency Department (HOSPITAL_COMMUNITY)
Admission: EM | Admit: 2017-11-22 | Discharge: 2017-11-22 | Disposition: A | Discharge status: Home / Self Care | Payer: Medicaid Other | Attending: Emergency Medicine | Admitting: Emergency Medicine

## 2017-11-22 ENCOUNTER — Encounter (HOSPITAL_COMMUNITY): Payer: Self-pay | Admitting: Emergency Medicine

## 2017-11-22 DIAGNOSIS — Z8673 Personal history of transient ischemic attack (TIA), and cerebral infarction without residual deficits: Secondary | ICD-10-CM | POA: Diagnosis not present

## 2017-11-22 DIAGNOSIS — Z87891 Personal history of nicotine dependence: Secondary | ICD-10-CM | POA: Diagnosis not present

## 2017-11-22 DIAGNOSIS — G894 Chronic pain syndrome: Secondary | ICD-10-CM | POA: Insufficient documentation

## 2017-11-22 DIAGNOSIS — Z79899 Other long term (current) drug therapy: Secondary | ICD-10-CM | POA: Diagnosis not present

## 2017-11-22 DIAGNOSIS — R42 Dizziness and giddiness: Secondary | ICD-10-CM | POA: Insufficient documentation

## 2017-11-22 LAB — CBC WITH DIFFERENTIAL/PLATELET
Abs Immature Granulocytes: 0.02 10*3/uL (ref 0.00–0.07)
BASOS ABS: 0 10*3/uL (ref 0.0–0.1)
Basophils Relative: 1 %
EOS PCT: 5 %
Eosinophils Absolute: 0.3 10*3/uL (ref 0.0–0.5)
HEMATOCRIT: 43 % (ref 39.0–52.0)
HEMOGLOBIN: 13.3 g/dL (ref 13.0–17.0)
IMMATURE GRANULOCYTES: 0 %
LYMPHS ABS: 1.5 10*3/uL (ref 0.7–4.0)
LYMPHS PCT: 23 %
MCH: 29 pg (ref 26.0–34.0)
MCHC: 30.9 g/dL (ref 30.0–36.0)
MCV: 93.9 fL (ref 80.0–100.0)
MONOS PCT: 9 %
Monocytes Absolute: 0.6 10*3/uL (ref 0.1–1.0)
NEUTROS PCT: 62 %
Neutro Abs: 3.8 10*3/uL (ref 1.7–7.7)
Platelets: 219 10*3/uL (ref 150–400)
RBC: 4.58 MIL/uL (ref 4.22–5.81)
RDW: 13.4 % (ref 11.5–15.5)
WBC: 6.3 10*3/uL (ref 4.0–10.5)
nRBC: 0 % (ref 0.0–0.2)

## 2017-11-22 LAB — RAPID URINE DRUG SCREEN, HOSP PERFORMED
AMPHETAMINES: NOT DETECTED
Barbiturates: NOT DETECTED
Benzodiazepines: POSITIVE — AB
COCAINE: NOT DETECTED
OPIATES: NOT DETECTED
TETRAHYDROCANNABINOL: NOT DETECTED

## 2017-11-22 LAB — URINALYSIS, ROUTINE W REFLEX MICROSCOPIC
Bilirubin Urine: NEGATIVE
Glucose, UA: NEGATIVE mg/dL
Hgb urine dipstick: NEGATIVE
Ketones, ur: NEGATIVE mg/dL
Leukocytes, UA: NEGATIVE
NITRITE: NEGATIVE
PROTEIN: NEGATIVE mg/dL
SPECIFIC GRAVITY, URINE: 1.002 — AB (ref 1.005–1.030)
pH: 6 (ref 5.0–8.0)

## 2017-11-22 LAB — I-STAT CHEM 8, ED
BUN: 9 mg/dL (ref 6–20)
Calcium, Ion: 1.22 mmol/L (ref 1.15–1.40)
Chloride: 103 mmol/L (ref 98–111)
Creatinine, Ser: 1.3 mg/dL — ABNORMAL HIGH (ref 0.61–1.24)
GLUCOSE: 75 mg/dL (ref 70–99)
HEMATOCRIT: 42 % (ref 39.0–52.0)
HEMOGLOBIN: 14.3 g/dL (ref 13.0–17.0)
POTASSIUM: 3.9 mmol/L (ref 3.5–5.1)
Sodium: 141 mmol/L (ref 135–145)
TCO2: 29 mmol/L (ref 22–32)

## 2017-11-22 LAB — I-STAT TROPONIN, ED: Troponin i, poc: 0.01 ng/mL (ref 0.00–0.08)

## 2017-11-22 MED ORDER — ACETAMINOPHEN 500 MG PO TABS
1000.0000 mg | ORAL_TABLET | Freq: Once | ORAL | Status: AC
  Administered 2017-11-22: 1000 mg via ORAL
  Filled 2017-11-22: qty 2

## 2017-11-22 NOTE — Discharge Instructions (Addendum)
The tests today are reassuring.  There is no sign of stroke, blood disorders or infection.  Follow-up with your doctor next week as scheduled.  Continue your usual medications.  Make sure you are drinking plenty of fluids.

## 2017-11-22 NOTE — ED Notes (Signed)
Patient given graham crackers and Sprite to drink.

## 2017-11-22 NOTE — ED Triage Notes (Signed)
Pt c/o dizziness when standing for over a week. Denies hx of vertigo.
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
## 2018-03-24 IMAGING — CT CT CERVICAL SPINE W/O CM
3 of 4 series · 9 of 33 positions shown, 11 images · non-contrast
Comparison: Cervical spine CT 01/31/2014

CLINICAL DATA: Cervical spinal fusion 1-2 years ago. Cervical spine
pain for 1 year, sometimes severe. Negative for trauma history.

EXAM:
CT CERVICAL SPINE WITHOUT CONTRAST
TECHNIQUE: Multidetector CT imaging of the cervical spine was performed without
intravenous contrast. Multiplanar CT image reconstructions were also
generated.

[Series 5: sagittal bone · sagittal · 0.23mm/px · 5 of 61 slices shown, 6 images]
[im 21/61  bone]
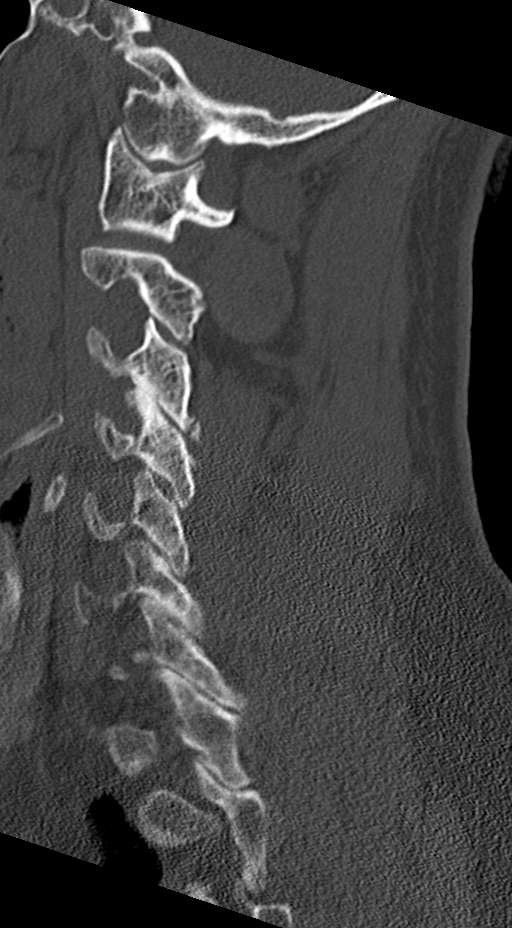
[im 26/61  bone]
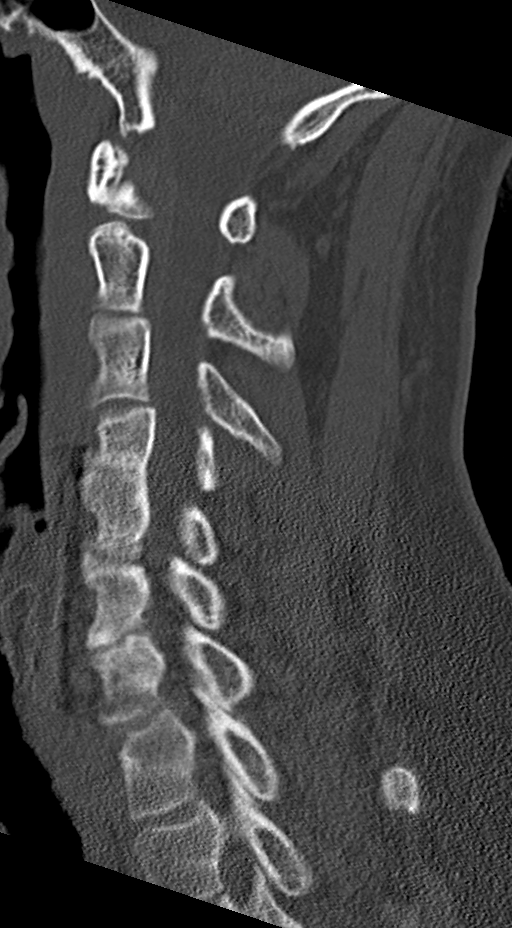
[im 31/61  soft-tissue]
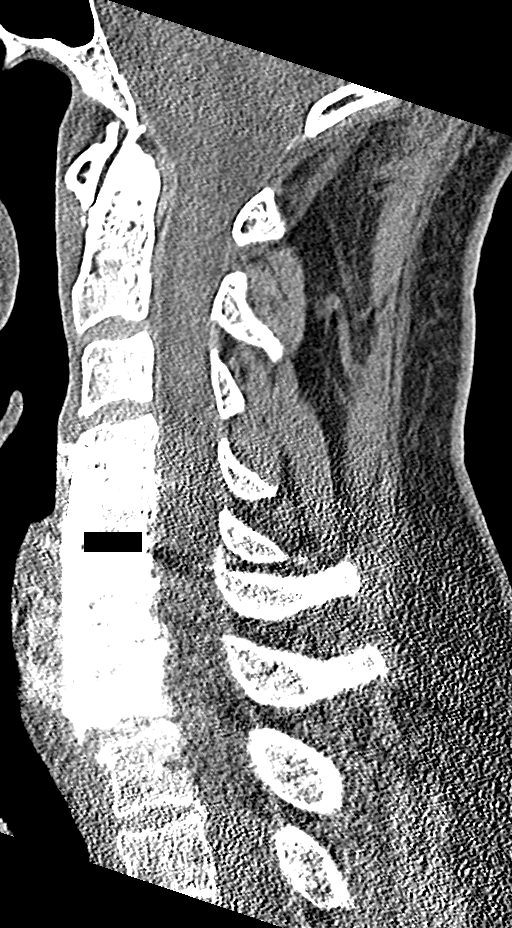
[im 31/61  bone]
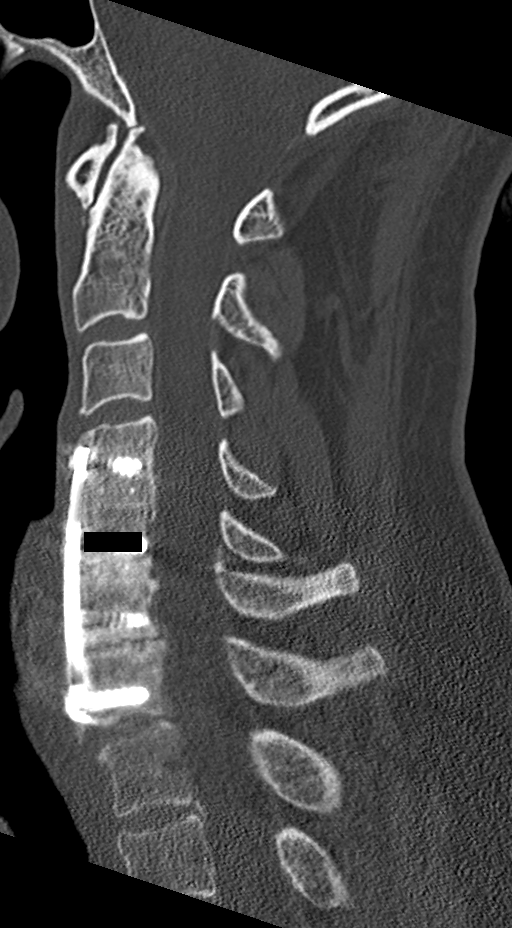
[im 36/61  bone]
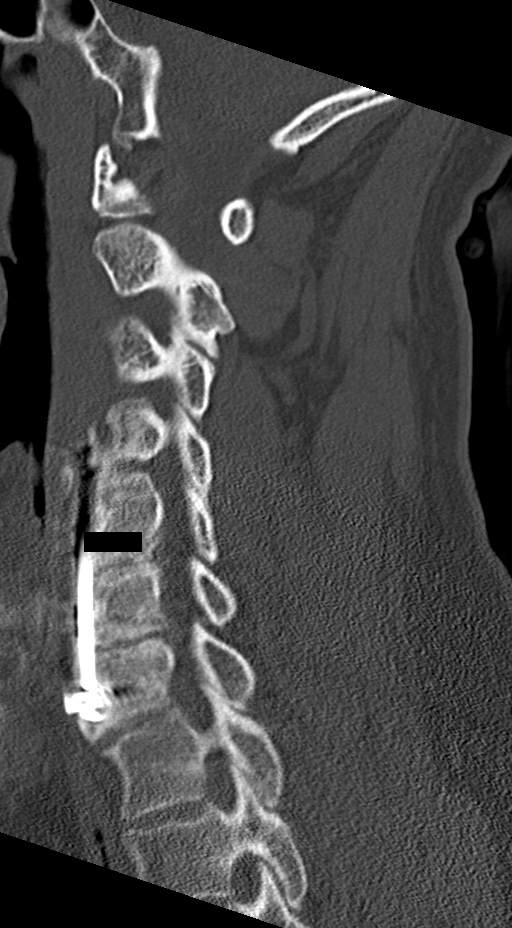
[im 41/61  bone]
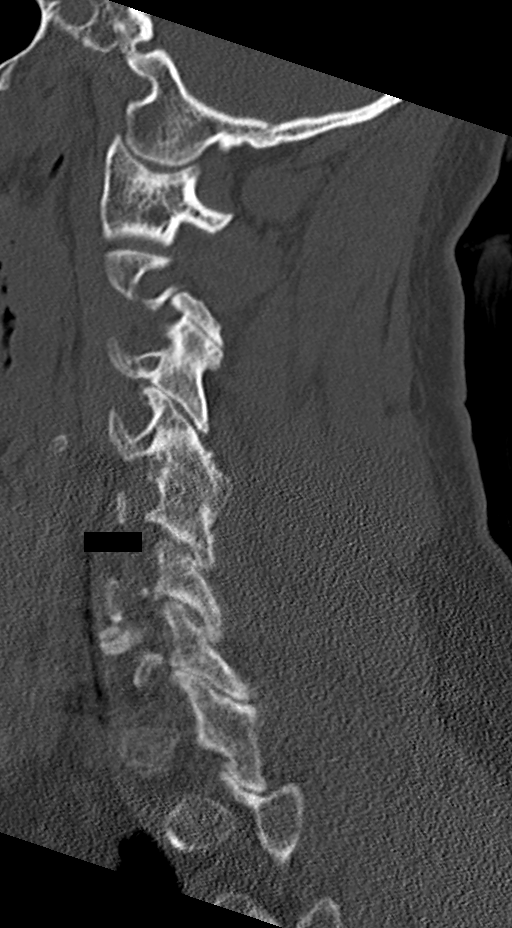

[Series 6: coronal bone · coronal · 0.23mm/px · 3 of 61 slices shown]
[im 13/61  bone]
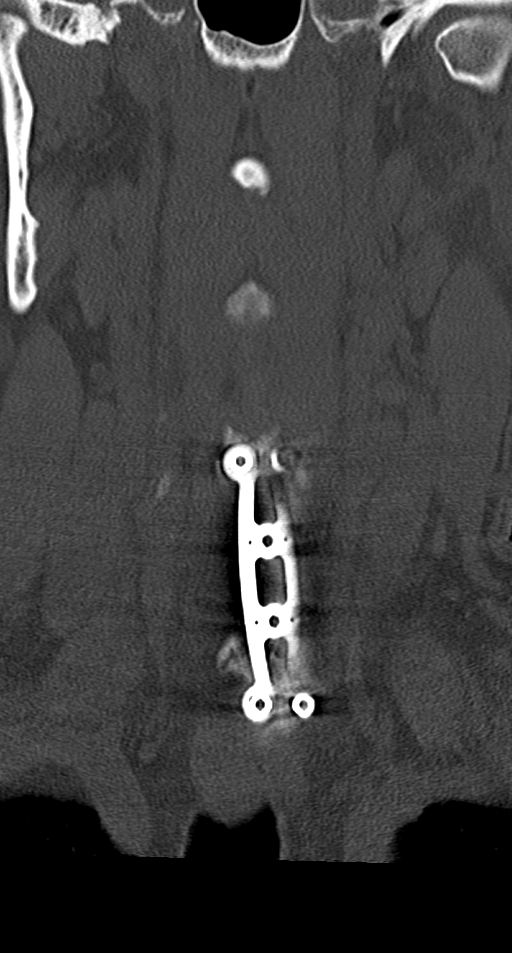
[im 25/61  bone]
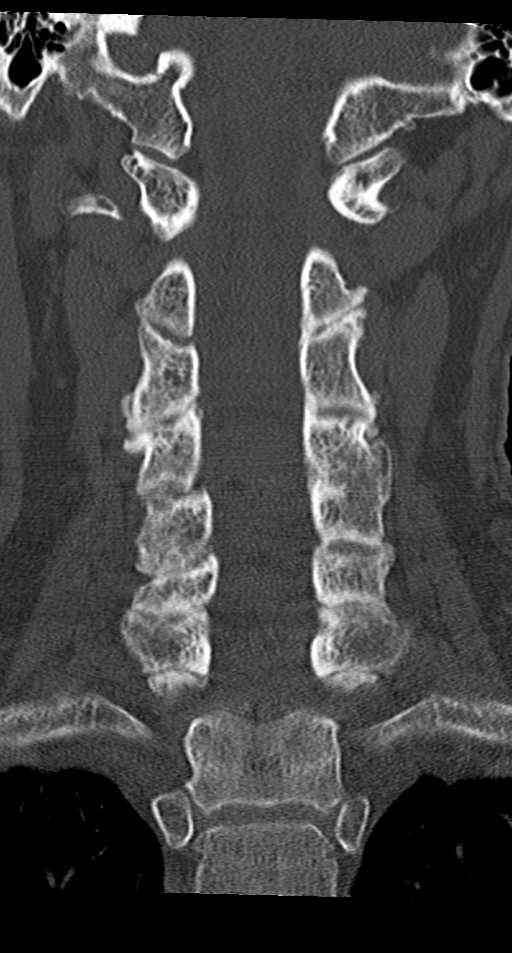
[im 37/61  bone]
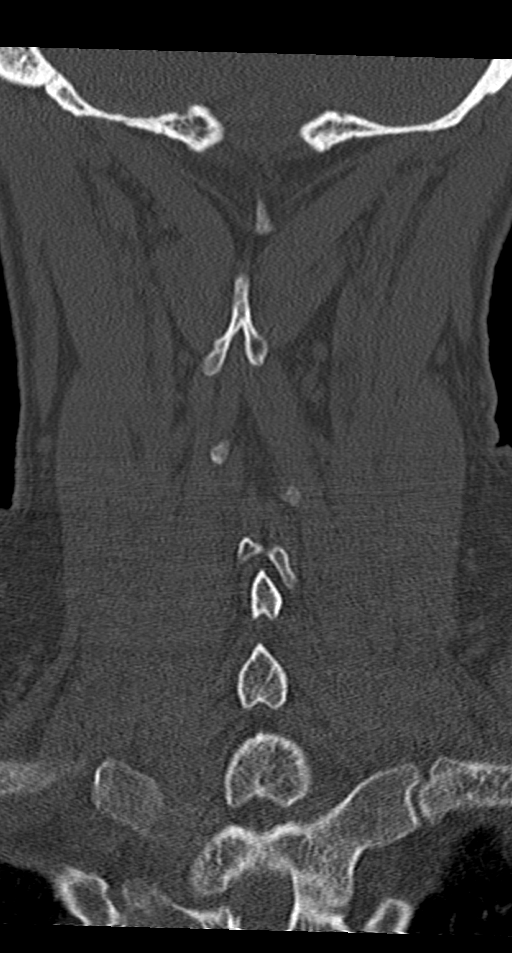

[Series 9: orthogonal bone · axial · 0.21mm/px · z∈[+1368,+1368]mm · 1 of 96 slices shown, 2 images]
[im 48/96  soft-tissue]
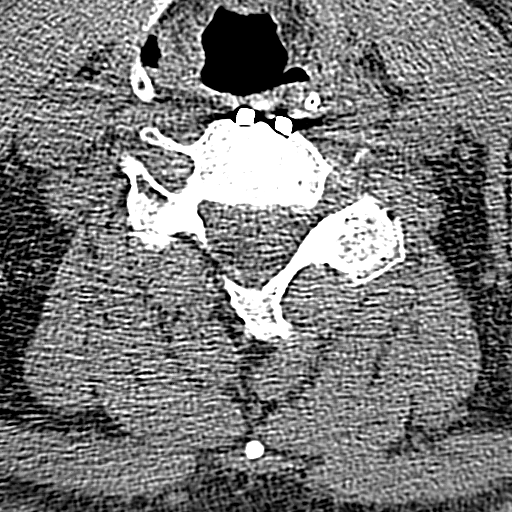
[im 48/96  bone]
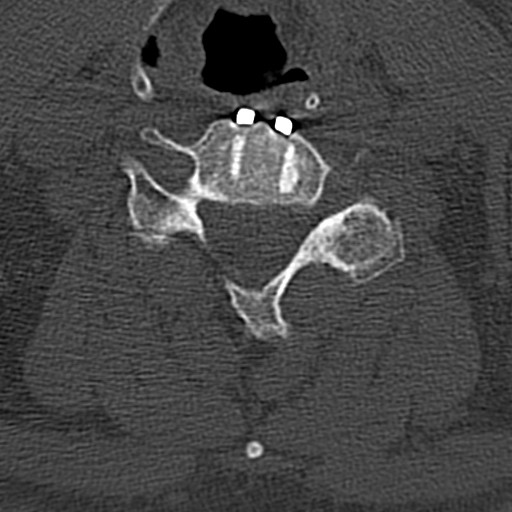

[9 of 33 positions shown; findings below may reference images not displayed]

FINDINGS: Alignment: Borderline anterolisthesis at C7-T1.

Skull base and vertebrae: Status post C4-5, C5-6, and C6-7 ACDF with
ventral plate. There is solid arthrodesis across the C4-5 and C5-6
interspaces. No convincing arthrodesis at C6-7. There is no lucency
around the screws but the left C7 screw is proud. Negative for
fracture, endplate erosion, or focal bone lesion.

Soft tissues and spinal canal: No significant incidental finding.

Disc levels:

C2-3: Facet arthropathy with asymmetric left-sided spurring that is
mild. These degenerative changes are new from prior.

C3-4: Facet arthropathy with right more than left moderate spurring,
increased. No evidence of disc herniation. Mild right foraminal
encroachment from facet spur.

C4-5: ACDF.  No evidence of residual impingement.

C5-6: ACDF.  No evidence of residual impingement.

C6-7: ACDF with no visible arthrodesis. There has been subsidence
and there is left-sided uncovertebral spurring with moderate left
foraminal stenosis. No evidence of cord impingement.

C7-T1:Mild disc narrowing and facet spurring. No visible
impingement.

Upper chest: No acute finding
IMPRESSION: 1. No convincing arthrodesis at the level of C6-7 ACDF. No lucency
about the screws, but the left C7 screw is proud. Cage subsidence
and left uncovertebral spurring causes moderate left foraminal
impingement.
2. C4-5 and C5-6 ACDF with solid arthrodesis and good canal and
foraminal patency.
3. C3-4 moderate and C2-3 mild facet arthropathy that is progressed
since 01/31/2014. Mild right foraminal narrowing at C3-4.

## 2018-06-20 ENCOUNTER — Other Ambulatory Visit: Payer: Self-pay

## 2018-06-20 ENCOUNTER — Ambulatory Visit (HOSPITAL_COMMUNITY)
Admission: RE | Admit: 2018-06-20 | Discharge: 2018-06-20 | Disposition: A | Discharge status: Home / Self Care | Payer: Medicaid Other | Source: Ambulatory Visit | Attending: Family Medicine | Admitting: Family Medicine

## 2018-06-20 ENCOUNTER — Other Ambulatory Visit (HOSPITAL_COMMUNITY): Payer: Self-pay | Admitting: Family Medicine

## 2018-06-20 DIAGNOSIS — M542 Cervicalgia: Secondary | ICD-10-CM | POA: Insufficient documentation

## 2018-06-20 DIAGNOSIS — M25511 Pain in right shoulder: Secondary | ICD-10-CM

## 2018-06-30 ENCOUNTER — Other Ambulatory Visit: Payer: Self-pay | Admitting: Family Medicine

## 2018-06-30 ENCOUNTER — Other Ambulatory Visit (HOSPITAL_COMMUNITY): Payer: Self-pay | Admitting: Family Medicine

## 2018-06-30 DIAGNOSIS — M542 Cervicalgia: Secondary | ICD-10-CM

## 2018-07-07 ENCOUNTER — Other Ambulatory Visit: Payer: Self-pay

## 2018-07-07 ENCOUNTER — Ambulatory Visit (HOSPITAL_COMMUNITY)
Admission: RE | Admit: 2018-07-07 | Discharge: 2018-07-07 | Disposition: A | Discharge status: Home / Self Care | Payer: Medicaid Other | Source: Ambulatory Visit | Attending: Family Medicine | Admitting: Family Medicine

## 2018-07-07 DIAGNOSIS — M542 Cervicalgia: Secondary | ICD-10-CM | POA: Diagnosis not present

## 2019-06-07 ENCOUNTER — Other Ambulatory Visit (HOSPITAL_COMMUNITY): Payer: Self-pay | Admitting: Nurse Practitioner

## 2019-06-07 DIAGNOSIS — M542 Cervicalgia: Secondary | ICD-10-CM

## 2019-06-08 ENCOUNTER — Other Ambulatory Visit: Payer: Self-pay

## 2019-06-08 ENCOUNTER — Telehealth: Payer: Self-pay

## 2019-06-08 ENCOUNTER — Ambulatory Visit (HOSPITAL_COMMUNITY)
Admission: RE | Admit: 2019-06-08 | Discharge: 2019-06-08 | Disposition: A | Discharge status: Home / Self Care | Payer: Medicaid Other | Source: Ambulatory Visit | Attending: Nurse Practitioner | Admitting: Nurse Practitioner

## 2019-06-08 DIAGNOSIS — M542 Cervicalgia: Secondary | ICD-10-CM

## 2019-06-20 ENCOUNTER — Encounter: Payer: Self-pay | Admitting: *Deleted

## 2019-06-21 ENCOUNTER — Ambulatory Visit: Payer: Medicaid Other | Admitting: Cardiology
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
## 2019-07-04 ENCOUNTER — Ambulatory Visit: Payer: Medicaid Other | Admitting: Cardiovascular Disease

## 2019-07-18 ENCOUNTER — Encounter (HOSPITAL_COMMUNITY): Payer: Self-pay | Admitting: Emergency Medicine

## 2019-07-18 ENCOUNTER — Emergency Department (HOSPITAL_COMMUNITY): Payer: Medicaid Other

## 2019-07-18 ENCOUNTER — Other Ambulatory Visit: Payer: Self-pay

## 2019-07-18 ENCOUNTER — Emergency Department (HOSPITAL_COMMUNITY)
Admission: EM | Admit: 2019-07-18 | Discharge: 2019-07-18 | Disposition: A | Discharge status: Home / Self Care | Payer: Medicaid Other | Attending: Emergency Medicine | Admitting: Emergency Medicine

## 2019-07-18 DIAGNOSIS — M542 Cervicalgia: Secondary | ICD-10-CM | POA: Diagnosis not present

## 2019-07-18 DIAGNOSIS — Z8673 Personal history of transient ischemic attack (TIA), and cerebral infarction without residual deficits: Secondary | ICD-10-CM | POA: Diagnosis not present

## 2019-07-18 DIAGNOSIS — Z87891 Personal history of nicotine dependence: Secondary | ICD-10-CM | POA: Diagnosis not present

## 2019-07-18 DIAGNOSIS — Z981 Arthrodesis status: Secondary | ICD-10-CM | POA: Diagnosis not present

## 2019-07-18 DIAGNOSIS — R0789 Other chest pain: Secondary | ICD-10-CM | POA: Diagnosis present

## 2019-07-18 DIAGNOSIS — R002 Palpitations: Secondary | ICD-10-CM | POA: Diagnosis not present

## 2019-07-18 DIAGNOSIS — R079 Chest pain, unspecified: Secondary | ICD-10-CM

## 2019-07-18 LAB — CBC
HCT: 50 % (ref 39.0–52.0)
Hemoglobin: 15.8 g/dL (ref 13.0–17.0)
MCH: 29.5 pg (ref 26.0–34.0)
MCHC: 31.6 g/dL (ref 30.0–36.0)
MCV: 93.3 fL (ref 80.0–100.0)
Platelets: 217 10*3/uL (ref 150–400)
RBC: 5.36 MIL/uL (ref 4.22–5.81)
RDW: 14.3 % (ref 11.5–15.5)
WBC: 6.9 10*3/uL (ref 4.0–10.5)
nRBC: 0 % (ref 0.0–0.2)

## 2019-07-18 LAB — BASIC METABOLIC PANEL
Anion gap: 10 (ref 5–15)
BUN: 15 mg/dL (ref 6–20)
CO2: 26 mmol/L (ref 22–32)
Calcium: 9.2 mg/dL (ref 8.9–10.3)
Chloride: 98 mmol/L (ref 98–111)
Creatinine, Ser: 1.47 mg/dL — ABNORMAL HIGH (ref 0.61–1.24)
GFR calc Af Amer: 59 mL/min — ABNORMAL LOW (ref >60)
GFR calc non Af Amer: 51 mL/min — ABNORMAL LOW (ref >60)
Glucose, Bld: 145 mg/dL — ABNORMAL HIGH (ref 70–99)
Potassium: 4.5 mmol/L (ref 3.5–5.1)
Sodium: 134 mmol/L — ABNORMAL LOW (ref 135–145)

## 2019-07-18 LAB — TROPONIN I (HIGH SENSITIVITY)
Troponin I (High Sensitivity): <2 ng/L (ref <18)
Troponin I (High Sensitivity): <2 ng/L (ref <18)
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
## 2019-07-18 NOTE — Discharge Instructions (Addendum)
You were seen in the emergency department for left-sided chest pain.  You had blood work EKG and a chest x-ray that did not show any serious findings.  Please follow-up with your regular doctor and also contact Dr. Verna Czech office to see if they can get you in sooner.  Return to the emergency department if any worsening or concerning symptoms.

## 2019-07-18 NOTE — ED Triage Notes (Signed)
Chest pain for the past 2 days intermittently.

## 2019-08-07 ENCOUNTER — Ambulatory Visit: Payer: Medicaid Other | Admitting: Cardiology

## 2019-11-10 ENCOUNTER — Emergency Department (HOSPITAL_COMMUNITY): Payer: Medicaid Other

## 2019-11-10 ENCOUNTER — Encounter (HOSPITAL_COMMUNITY): Payer: Self-pay | Admitting: *Deleted

## 2019-11-10 ENCOUNTER — Emergency Department (HOSPITAL_COMMUNITY)
Admission: EM | Admit: 2019-11-10 | Discharge: 2019-11-10 | Disposition: A | Discharge status: Home / Self Care | Payer: Medicaid Other | Attending: Emergency Medicine | Admitting: Emergency Medicine

## 2019-11-10 ENCOUNTER — Other Ambulatory Visit: Payer: Self-pay

## 2019-11-10 DIAGNOSIS — Z87891 Personal history of nicotine dependence: Secondary | ICD-10-CM | POA: Diagnosis not present

## 2019-11-10 DIAGNOSIS — Z8673 Personal history of transient ischemic attack (TIA), and cerebral infarction without residual deficits: Secondary | ICD-10-CM | POA: Insufficient documentation

## 2019-11-10 DIAGNOSIS — M5417 Radiculopathy, lumbosacral region: Secondary | ICD-10-CM | POA: Diagnosis not present

## 2019-11-10 DIAGNOSIS — M545 Low back pain, unspecified: Secondary | ICD-10-CM | POA: Diagnosis present

## 2019-11-10 MED ORDER — METHOCARBAMOL 500 MG PO TABS
1000.0000 mg | ORAL_TABLET | Freq: Once | ORAL | Status: AC
  Administered 2019-11-10: 1000 mg via ORAL
  Filled 2019-11-10: qty 2

## 2019-11-10 MED ORDER — KETOROLAC TROMETHAMINE 60 MG/2ML IM SOLN
30.0000 mg | Freq: Once | INTRAMUSCULAR | Status: DC
  Filled 2019-11-10: qty 2

## 2019-11-10 NOTE — ED Triage Notes (Signed)
Pt c/o lower back pain; pt states he stepped off a step the wrong way x 5 days ago and states the pain has gotten progressively worse; pt states he went to urgent care today and was told "something is wrong with my spleen"

## 2019-11-10 NOTE — Discharge Instructions (Signed)

## 2019-11-10 NOTE — ED Notes (Signed)
Dr Bebe Shaggy made aware of pt refusal of toradol
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
## 2019-11-13 ENCOUNTER — Other Ambulatory Visit (HOSPITAL_COMMUNITY): Payer: Self-pay | Admitting: Family Medicine

## 2019-11-13 ENCOUNTER — Other Ambulatory Visit: Payer: Self-pay | Admitting: Family Medicine

## 2019-11-13 DIAGNOSIS — M5442 Lumbago with sciatica, left side: Secondary | ICD-10-CM

## 2019-11-16 ENCOUNTER — Other Ambulatory Visit: Payer: Self-pay

## 2019-11-16 ENCOUNTER — Ambulatory Visit (HOSPITAL_COMMUNITY)
Admission: RE | Admit: 2019-11-16 | Discharge: 2019-11-16 | Disposition: A | Discharge status: Home / Self Care | Payer: Medicaid Other | Source: Ambulatory Visit | Attending: Family Medicine | Admitting: Family Medicine

## 2019-11-16 DIAGNOSIS — M5442 Lumbago with sciatica, left side: Secondary | ICD-10-CM | POA: Diagnosis present

## 2019-11-22 ENCOUNTER — Other Ambulatory Visit: Payer: Self-pay | Admitting: Family Medicine

## 2019-11-22 DIAGNOSIS — M5440 Lumbago with sciatica, unspecified side: Secondary | ICD-10-CM

## 2019-11-28 ENCOUNTER — Other Ambulatory Visit: Payer: Medicaid Other

## 2019-11-29 ENCOUNTER — Ambulatory Visit
Admission: RE | Admit: 2019-11-29 | Discharge: 2019-11-29 | Disposition: A | Discharge status: Home / Self Care | Payer: Medicaid Other | Source: Ambulatory Visit | Attending: Family Medicine | Admitting: Family Medicine

## 2019-11-29 ENCOUNTER — Other Ambulatory Visit: Payer: Self-pay

## 2019-11-29 DIAGNOSIS — M5440 Lumbago with sciatica, unspecified side: Secondary | ICD-10-CM

## 2019-11-29 MED ORDER — METHYLPREDNISOLONE ACETATE 40 MG/ML INJ SUSP (RADIOLOG
120.0000 mg | Freq: Once | INTRAMUSCULAR | Status: AC
  Administered 2019-11-29: 120 mg via EPIDURAL

## 2019-11-29 MED ORDER — IOPAMIDOL (ISOVUE-M 200) INJECTION 41%
1.0000 mL | Freq: Once | INTRAMUSCULAR | Status: AC
  Administered 2019-11-29: 1 mL via EPIDURAL

## 2019-11-29 NOTE — Discharge Instructions (Signed)

## 2019-11-30 ENCOUNTER — Other Ambulatory Visit: Payer: Medicaid Other

## 2020-09-14 ENCOUNTER — Emergency Department (HOSPITAL_COMMUNITY)
Admission: EM | Admit: 2020-09-14 | Discharge: 2020-09-14 | Disposition: A | Discharge status: Home / Self Care | Payer: Medicaid Other | Attending: Emergency Medicine | Admitting: Emergency Medicine

## 2020-09-14 ENCOUNTER — Other Ambulatory Visit: Payer: Self-pay

## 2020-09-14 ENCOUNTER — Encounter (HOSPITAL_COMMUNITY): Payer: Self-pay | Admitting: *Deleted

## 2020-09-14 ENCOUNTER — Emergency Department (HOSPITAL_COMMUNITY): Payer: Medicaid Other

## 2020-09-14 DIAGNOSIS — R079 Chest pain, unspecified: Secondary | ICD-10-CM | POA: Diagnosis present

## 2020-09-14 DIAGNOSIS — F13239 Sedative, hypnotic or anxiolytic dependence with withdrawal, unspecified: Secondary | ICD-10-CM | POA: Diagnosis not present

## 2020-09-14 DIAGNOSIS — Z79899 Other long term (current) drug therapy: Secondary | ICD-10-CM | POA: Insufficient documentation

## 2020-09-14 DIAGNOSIS — Z87891 Personal history of nicotine dependence: Secondary | ICD-10-CM | POA: Diagnosis not present

## 2020-09-14 DIAGNOSIS — F13939 Sedative, hypnotic or anxiolytic use, unspecified with withdrawal, unspecified: Secondary | ICD-10-CM

## 2020-09-14 DIAGNOSIS — R1010 Upper abdominal pain, unspecified: Secondary | ICD-10-CM | POA: Insufficient documentation

## 2020-09-14 LAB — CBC WITH DIFFERENTIAL/PLATELET
Abs Immature Granulocytes: 0.04 10*3/uL (ref 0.00–0.07)
Basophils Absolute: 0 10*3/uL (ref 0.0–0.1)
Basophils Relative: 0 %
Eosinophils Absolute: 0 10*3/uL (ref 0.0–0.5)
Eosinophils Relative: 0 %
HCT: 46.3 % (ref 39.0–52.0)
Hemoglobin: 15.3 g/dL (ref 13.0–17.0)
Immature Granulocytes: 0 %
Lymphocytes Relative: 5 %
Lymphs Abs: 0.4 10*3/uL — ABNORMAL LOW (ref 0.7–4.0)
MCH: 29.9 pg (ref 26.0–34.0)
MCHC: 33 g/dL (ref 30.0–36.0)
MCV: 90.4 fL (ref 80.0–100.0)
Monocytes Absolute: 0.5 10*3/uL (ref 0.1–1.0)
Monocytes Relative: 5 %
Neutro Abs: 8.6 10*3/uL — ABNORMAL HIGH (ref 1.7–7.7)
Neutrophils Relative %: 90 %
Platelets: 208 10*3/uL (ref 150–400)
RBC: 5.12 MIL/uL (ref 4.22–5.81)
RDW: 12.8 % (ref 11.5–15.5)
WBC: 9.6 10*3/uL (ref 4.0–10.5)
nRBC: 0 % (ref 0.0–0.2)

## 2020-09-14 LAB — URINALYSIS, MICROSCOPIC (REFLEX): Bacteria, UA: NONE SEEN

## 2020-09-14 LAB — COMPREHENSIVE METABOLIC PANEL
ALT: 27 U/L (ref 0–44)
AST: 51 U/L — ABNORMAL HIGH (ref 15–41)
Albumin: 4.8 g/dL (ref 3.5–5.0)
Alkaline Phosphatase: 90 U/L (ref 38–126)
Anion gap: 8 (ref 5–15)
BUN: 22 mg/dL (ref 8–23)
CO2: 25 mmol/L (ref 22–32)
Calcium: 9.4 mg/dL (ref 8.9–10.3)
Chloride: 101 mmol/L (ref 98–111)
Creatinine, Ser: 0.99 mg/dL (ref 0.61–1.24)
GFR, Estimated: >60 mL/min (ref >60)
Glucose, Bld: 111 mg/dL — ABNORMAL HIGH (ref 70–99)
Potassium: 4 mmol/L (ref 3.5–5.1)
Sodium: 134 mmol/L — ABNORMAL LOW (ref 135–145)
Total Bilirubin: 1.1 mg/dL (ref 0.3–1.2)
Total Protein: 7.5 g/dL (ref 6.5–8.1)

## 2020-09-14 LAB — URINALYSIS, ROUTINE W REFLEX MICROSCOPIC
Bilirubin Urine: NEGATIVE
Glucose, UA: NEGATIVE mg/dL
Ketones, ur: >80 mg/dL — AB
Leukocytes,Ua: NEGATIVE
Nitrite: NEGATIVE
Protein, ur: 100 mg/dL — AB
Specific Gravity, Urine: 1.025 (ref 1.005–1.030)
pH: 6.5 (ref 5.0–8.0)

## 2020-09-14 LAB — ETHANOL: Alcohol, Ethyl (B): <10 mg/dL (ref <10)

## 2020-09-14 LAB — LIPASE, BLOOD: Lipase: 25 U/L (ref 11–51)

## 2020-09-14 LAB — RAPID URINE DRUG SCREEN, HOSP PERFORMED
Amphetamines: NOT DETECTED
Barbiturates: NOT DETECTED
Benzodiazepines: POSITIVE — AB
Cocaine: NOT DETECTED
Opiates: NOT DETECTED
Tetrahydrocannabinol: NOT DETECTED

## 2020-09-14 LAB — TROPONIN I (HIGH SENSITIVITY)
Troponin I (High Sensitivity): 5 ng/L (ref <18)
Troponin I (High Sensitivity): 6 ng/L (ref <18)

## 2020-09-14 MED ORDER — ALPRAZOLAM 2 MG PO TABS: 2.0000 mg | ORAL_TABLET | Freq: Four times a day (QID) | ORAL | 0 refills | Status: AC

## 2020-09-14 MED ORDER — LORAZEPAM 2 MG/ML IJ SOLN
1.0000 mg | Freq: Once | INTRAMUSCULAR | Status: AC
  Administered 2020-09-14: 1 mg via INTRAVENOUS
  Filled 2020-09-14: qty 1
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
## 2020-09-14 NOTE — ED Triage Notes (Signed)
Pt with feelings of anxiety, diaphoresis, trouble sleeping. Pt has not taken Xanax or hydrocodone in past 3 days.  Pt with chest pain as well.

## 2020-09-14 NOTE — ED Notes (Signed)
Discharge instructions discussed with pt. Pt verbalized understanding with no questions at this time. Pt to go home with friend who will pick up from lobby. Signature pad not available for discharge signature.

## 2020-09-14 NOTE — Discharge Instructions (Addendum)
Take your medication as directed.  Your work-up today was reassuring.  Please follow-up with Dr. Michelle Nasuti office next week.

## 2020-09-16 ENCOUNTER — Telehealth (HOSPITAL_COMMUNITY): Payer: Self-pay | Admitting: Student

## 2020-09-16 MED ORDER — CLONAZEPAM 0.5 MG PO TABS: 0.5000 mg | ORAL_TABLET | Freq: Two times a day (BID) | ORAL | 0 refills | Status: AC | PRN

## 2020-09-16 NOTE — Telephone Encounter (Signed)
Encounter created after I received a call from the patient's pharmacy ([facility]) stating that Medicare will not allow the patient to fill the prescription as the patient's long-term prescription has been sent to a different pharmacy.  The patient is currently without medication and is looking for a short term supply which him until the other pharmacy opens.  I spoke with the pharmacist at [facility] and we decided to send clonazepam 2 mg, 5 pills to the [facility] as this is [facility] to the patient's Xanax prescription.

## 2021-03-04 ENCOUNTER — Encounter (HOSPITAL_COMMUNITY): Payer: Self-pay

## 2021-03-04 ENCOUNTER — Other Ambulatory Visit: Payer: Self-pay

## 2021-03-04 ENCOUNTER — Emergency Department (HOSPITAL_COMMUNITY): Payer: Medicaid Other

## 2021-03-04 ENCOUNTER — Emergency Department (HOSPITAL_COMMUNITY)
Admission: EM | Admit: 2021-03-04 | Discharge: 2021-03-04 | Disposition: A | Discharge status: Home / Self Care | Payer: Medicaid Other | Attending: Emergency Medicine | Admitting: Emergency Medicine

## 2021-03-04 DIAGNOSIS — R300 Dysuria: Secondary | ICD-10-CM | POA: Insufficient documentation

## 2021-03-04 DIAGNOSIS — R202 Paresthesia of skin: Secondary | ICD-10-CM | POA: Diagnosis not present

## 2021-03-04 DIAGNOSIS — R1032 Left lower quadrant pain: Secondary | ICD-10-CM | POA: Insufficient documentation

## 2021-03-04 DIAGNOSIS — F419 Anxiety disorder, unspecified: Secondary | ICD-10-CM | POA: Diagnosis not present

## 2021-03-04 DIAGNOSIS — R000 Tachycardia, unspecified: Secondary | ICD-10-CM | POA: Insufficient documentation

## 2021-03-04 LAB — COMPREHENSIVE METABOLIC PANEL
ALT: 21 U/L (ref 0–44)
AST: 36 U/L (ref 15–41)
Albumin: 4.7 g/dL (ref 3.5–5.0)
Alkaline Phosphatase: 81 U/L (ref 38–126)
Anion gap: 8 (ref 5–15)
BUN: 16 mg/dL (ref 8–23)
CO2: 23 mmol/L (ref 22–32)
Calcium: 9.4 mg/dL (ref 8.9–10.3)
Chloride: 103 mmol/L (ref 98–111)
Creatinine, Ser: 1.09 mg/dL (ref 0.61–1.24)
GFR, Estimated: >60 mL/min (ref >60)
Glucose, Bld: 105 mg/dL — ABNORMAL HIGH (ref 70–99)
Potassium: 4.1 mmol/L (ref 3.5–5.1)
Sodium: 134 mmol/L — ABNORMAL LOW (ref 135–145)
Total Bilirubin: 1.4 mg/dL — ABNORMAL HIGH (ref 0.3–1.2)
Total Protein: 7.6 g/dL (ref 6.5–8.1)

## 2021-03-04 LAB — LIPASE, BLOOD: Lipase: 25 U/L (ref 11–51)

## 2021-03-04 LAB — URINALYSIS, ROUTINE W REFLEX MICROSCOPIC
Bacteria, UA: NONE SEEN
Bilirubin Urine: NEGATIVE
Glucose, UA: NEGATIVE mg/dL
Hgb urine dipstick: NEGATIVE
Ketones, ur: 80 mg/dL — AB
Leukocytes,Ua: NEGATIVE
Nitrite: NEGATIVE
Protein, ur: 100 mg/dL — AB
Specific Gravity, Urine: 1.023 (ref 1.005–1.030)
pH: 8 (ref 5.0–8.0)

## 2021-03-04 LAB — CBC WITH DIFFERENTIAL/PLATELET
Abs Immature Granulocytes: 0.01 10*3/uL (ref 0.00–0.07)
Basophils Absolute: 0 10*3/uL (ref 0.0–0.1)
Basophils Relative: 0 %
Eosinophils Absolute: 0 10*3/uL (ref 0.0–0.5)
Eosinophils Relative: 0 %
HCT: 44.4 % (ref 39.0–52.0)
Hemoglobin: 14.4 g/dL (ref 13.0–17.0)
Immature Granulocytes: 0 %
Lymphocytes Relative: 10 %
Lymphs Abs: 0.7 10*3/uL (ref 0.7–4.0)
MCH: 29.3 pg (ref 26.0–34.0)
MCHC: 32.4 g/dL (ref 30.0–36.0)
MCV: 90.2 fL (ref 80.0–100.0)
Monocytes Absolute: 0.6 10*3/uL (ref 0.1–1.0)
Monocytes Relative: 8 %
Neutro Abs: 6.1 10*3/uL (ref 1.7–7.7)
Neutrophils Relative %: 82 %
Platelets: 224 10*3/uL (ref 150–400)
RBC: 4.92 MIL/uL (ref 4.22–5.81)
RDW: 13.6 % (ref 11.5–15.5)
WBC: 7.4 10*3/uL (ref 4.0–10.5)
nRBC: 0 % (ref 0.0–0.2)

## 2021-03-04 MED ORDER — HYDROMORPHONE HCL 1 MG/ML IJ SOLN
0.5000 mg | Freq: Once | INTRAMUSCULAR | Status: AC
  Administered 2021-03-04: 0.5 mg via INTRAVENOUS
  Filled 2021-03-04: qty 1

## 2021-03-04 MED ORDER — LORAZEPAM 1 MG PO TABS
1.0000 mg | ORAL_TABLET | Freq: Once | ORAL | Status: AC
  Administered 2021-03-04: 1 mg via ORAL
  Filled 2021-03-04: qty 1

## 2021-03-04 MED ORDER — IOHEXOL 300 MG/ML  SOLN
100.0000 mL | Freq: Once | INTRAMUSCULAR | Status: AC | PRN
  Administered 2021-03-04: 100 mL via INTRAVENOUS

## 2021-03-04 MED ORDER — SODIUM CHLORIDE 0.9 % IV BOLUS
1000.0000 mL | Freq: Once | INTRAVENOUS | Status: AC
  Administered 2021-03-04: 1000 mL via INTRAVENOUS

## 2021-03-04 NOTE — ED Triage Notes (Signed)
Pt arrived via [facility] from home walked from inside his house into the truck. Pt nonverbal while in truck and pointed to Abd for pain. EMS was told that pt had ran out of medications and needed pain meds. Pt unable to walk from stretcher in ED. Pt is able to talk with nurse with extra encouragement but doesn't like to speak. Hand gestures are prominent.
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
## 2021-03-04 NOTE — Discharge Instructions (Signed)
Be sure to have your pain medicine and anxiety medicine refilled.  Follow-up with your primary care provider this week for recheck of your blood pressure and anxiety.  Return to the emergency department for any new or worsening symptoms.
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
## 2021-09-12 ENCOUNTER — Emergency Department (HOSPITAL_COMMUNITY): Payer: Medicaid Other

## 2021-09-12 ENCOUNTER — Emergency Department (HOSPITAL_COMMUNITY)
Admission: EM | Admit: 2021-09-12 | Discharge: 2021-09-12 | Disposition: A | Discharge status: Home / Self Care | Payer: Medicaid Other | Attending: Emergency Medicine | Admitting: Emergency Medicine

## 2021-09-12 ENCOUNTER — Other Ambulatory Visit: Payer: Self-pay

## 2021-09-12 ENCOUNTER — Encounter (HOSPITAL_COMMUNITY): Payer: Self-pay | Admitting: *Deleted

## 2021-09-12 DIAGNOSIS — R200 Anesthesia of skin: Secondary | ICD-10-CM | POA: Insufficient documentation

## 2021-09-12 DIAGNOSIS — R1012 Left upper quadrant pain: Secondary | ICD-10-CM | POA: Insufficient documentation

## 2021-09-12 DIAGNOSIS — K5900 Constipation, unspecified: Secondary | ICD-10-CM

## 2021-09-12 LAB — COMPREHENSIVE METABOLIC PANEL
ALT: 20 U/L (ref 0–44)
AST: 26 U/L (ref 15–41)
Albumin: 4.6 g/dL (ref 3.5–5.0)
Alkaline Phosphatase: 69 U/L (ref 38–126)
Anion gap: 8 (ref 5–15)
BUN: 11 mg/dL (ref 8–23)
CO2: 27 mmol/L (ref 22–32)
Calcium: 9.5 mg/dL (ref 8.9–10.3)
Chloride: 95 mmol/L — ABNORMAL LOW (ref 98–111)
Creatinine, Ser: 1.06 mg/dL (ref 0.61–1.24)
GFR, Estimated: >60 mL/min (ref >60)
Glucose, Bld: 114 mg/dL — ABNORMAL HIGH (ref 70–99)
Potassium: 4.7 mmol/L (ref 3.5–5.1)
Sodium: 130 mmol/L — ABNORMAL LOW (ref 135–145)
Total Bilirubin: 0.9 mg/dL (ref 0.3–1.2)
Total Protein: 7.3 g/dL (ref 6.5–8.1)

## 2021-09-12 LAB — URINALYSIS, ROUTINE W REFLEX MICROSCOPIC
Bacteria, UA: NONE SEEN
Bilirubin Urine: NEGATIVE
Glucose, UA: NEGATIVE mg/dL
Hgb urine dipstick: NEGATIVE
Ketones, ur: 20 mg/dL — AB
Leukocytes,Ua: NEGATIVE
Nitrite: NEGATIVE
Protein, ur: 30 mg/dL — AB
Specific Gravity, Urine: 1.021 (ref 1.005–1.030)
pH: 5 (ref 5.0–8.0)

## 2021-09-12 LAB — CBC
HCT: 45.6 % (ref 39.0–52.0)
Hemoglobin: 15.4 g/dL (ref 13.0–17.0)
MCH: 30 pg (ref 26.0–34.0)
MCHC: 33.8 g/dL (ref 30.0–36.0)
MCV: 88.9 fL (ref 80.0–100.0)
Platelets: 262 10*3/uL (ref 150–400)
RBC: 5.13 MIL/uL (ref 4.22–5.81)
RDW: 13.5 % (ref 11.5–15.5)
WBC: 10.2 10*3/uL (ref 4.0–10.5)
nRBC: 0 % (ref 0.0–0.2)

## 2021-09-12 LAB — LIPASE, BLOOD: Lipase: 36 U/L (ref 11–51)

## 2021-09-12 MED ORDER — POLYETHYLENE GLYCOL 3350 17 GM/SCOOP PO POWD: 1.0000 | Freq: Once | ORAL | 0 refills | Status: AC

## 2021-09-12 MED ORDER — POLYETHYLENE GLYCOL 3350 17 G PO PACK
17.0000 g | PACK | Freq: Once | ORAL | Status: AC
  Administered 2021-09-12: 17 g via ORAL
  Filled 2021-09-12: qty 1

## 2021-09-12 MED ORDER — LORAZEPAM 1 MG PO TABS
1.0000 mg | ORAL_TABLET | Freq: Once | ORAL | Status: AC
  Administered 2021-09-12: 1 mg via ORAL
  Filled 2021-09-12: qty 1

## 2021-09-12 MED ORDER — IOHEXOL 300 MG/ML  SOLN
100.0000 mL | Freq: Once | INTRAMUSCULAR | Status: AC | PRN
  Administered 2021-09-12: 100 mL via INTRAVENOUS

## 2021-09-12 NOTE — ED Notes (Signed)
Pt taken to ct 

## 2021-09-12 NOTE — ED Provider Notes (Signed)
Physical Exam  BP (!) 152/97 (BP Location: Right Arm)   Pulse 85   Temp 98.5 F (36.9 C) (Oral)   Resp 20   Ht 6' (1.829 m)   SpO2 100%   BMI 23.12 kg/m   Physical Exam Vitals and nursing note reviewed.  Constitutional:      Appearance: Normal appearance.  HENT:     Head: Normocephalic and atraumatic.  Eyes:     Conjunctiva/sclera: Conjunctivae normal.  Pulmonary:     Effort: Pulmonary effort is normal. No respiratory distress.  Skin:    General: Skin is warm and dry.  Neurological:     Mental Status: He is alert.  Psychiatric:        Mood and Affect: Mood normal.        Behavior: Behavior normal.     Results   CT ABDOMEN PELVIS W CONTRAST  Result Date: 09/12/2021 CLINICAL DATA:  Left lower and left upper quadrant pain. EXAM: CT ABDOMEN AND PELVIS WITH CONTRAST TECHNIQUE: Multidetector CT imaging of the abdomen and pelvis was performed using the standard protocol following bolus administration of intravenous contrast. RADIATION DOSE REDUCTION: This exam was performed according to the departmental dose-optimization program which includes automated exposure control, adjustment of the mA and/or kV according to patient size and/or use of iterative reconstruction technique. CONTRAST:  OMNIPAQUE IOHEXOL 300 MG/ML  SOLN COMPARISON:  Most recent CT available 03/04/2021 FINDINGS: Lower chest: No acute findings. Linear scarring in the left lower lobe. Heart is normal in size. Hepatobiliary: Mild subjective hepatic steatosis. Stable cysts in the liver, needing no further follow-up imaging. No suspicious hepatic lesion. Gallbladder physiologically distended, no calcified stone. No biliary dilatation. Pancreas: 12 mm well-defined cyst in the pancreatic head, series 3, image 33. This is retrospectively unchanged from prior exam. No ductal dilatation or inflammation. Spleen: Normal in size without focal abnormality. Adrenals/Urinary Tract: No adrenal nodule. No hydronephrosis or  perinephric edema. Homogeneous renal enhancement with symmetric excretion on delayed phase imaging. No renal calculi or solid lesion. Urinary bladder is partially distended without wall thickening. Stomach/Bowel: Detailed bowel assessment is limited in the absence of enteric contrast. Stomach is decompressed. No small bowel obstruction or evident inflammation. Appendix not visualized, appendectomy per history. Moderate volume of colonic stool. No colonic inflammation. Vascular/Lymphatic: Minimal aortic atherosclerosis. No aortic aneurysm. Circumaortic left renal vein. No suspicious adenopathy. Reproductive: Prostate is unremarkable. Other: No ascites or free air.  No abdominal wall hernia. Musculoskeletal: Mild scattered degenerative disc disease in the lumbar spine. There are no acute or suspicious osseous abnormalities. IMPRESSION: 1. No acute abnormality in the abdomen/pelvis. 2. Moderate volume of colonic stool, can be seen with constipation. 3. Stable 12 mm cyst in the pancreatic head since February 2023 CT. Consensus guidelines recommendation for a cyst of this size recommends imaging yearly for 5 years to ensure stability. Recommend initial follow-up pancreatic protocol CT or MRI in 1 year. 4. Mild hepatic steatosis. Aortic Atherosclerosis (ICD10-I70.0). Electronically Signed   By: Narda Rutherford M.D.   On: 09/12/2021 18:52   CT Head Wo Contrast  Result Date: 09/12/2021 CLINICAL DATA:  Pain nausea vomiting diarrhea EXAM: CT HEAD WITHOUT CONTRAST TECHNIQUE: Contiguous axial images were obtained from the base of the skull through the vertex without intravenous contrast. RADIATION DOSE REDUCTION: This exam was performed according to the departmental dose-optimization program which includes automated exposure control, adjustment of the mA and/or kV according to patient size and/or use of iterative reconstruction technique. COMPARISON:  Brain MRI 08/19/2015,  CT brain 01/31/2014 FINDINGS: Brain: No acute  territorial infarction, hemorrhage or intracranial mass. Atrophy and mild chronic small vessel ischemic changes of the white matter. The ventricles are nonenlarged Vascular: No hyperdense vessels.  Carotid vascular calcification Skull: Normal. Negative for fracture or focal lesion. Sinuses/Orbits: No acute finding. Other: None IMPRESSION: 1. No CT evidence for acute intracranial abnormality. 2. Mild atrophy and chronic small vessel ischemic changes of the white matter Electronically Signed   By: Jasmine Pang M.D.   On: 09/12/2021 18:47     ED Course / MDM    Medical Decision Making Amount and/or Complexity of Data Reviewed Labs: ordered. Radiology: ordered.  Risk Prescription drug management.   Accepted handoff at shift change from [facility]. Please see prior provider note for full HPI.  Briefly: Patient is 61 year old male who presents to the ER for LUQ abdominal pain, intermittent left sided facial numbness and right forearm numbness. Normal neurologic exam.   DDX/Plan: Await CT head and abdomen. Likely d/c to home if results normal.   Consulted TOC to assist with PCP needs.   Discussed results with the patient. After consideration of the diagnostic results and the patients response to treatment, I feel that emergency department workup does not suggest an emergent condition requiring admission or immediate intervention beyond what has been performed at this time. The plan is: Will give dose of miralax for him to take home with him and recommend miralax clean out at home. . The patient is safe for discharge and has been instructed to return immediately for worsening symptoms, change in symptoms or any other concerns.  Portions of this report may have been transcribed using voice recognition software. Every effort was made to ensure accuracy; however, inadvertent computerized transcription errors may be present.    Jeanella Flattery 09/12/21 2002    Lonell Grandchild,  MD 09/12/21 2241

## 2021-09-12 NOTE — Discharge Instructions (Addendum)
You were seen in the emergency department for abdominal pain and face tingling.  As we discussed your lab work and imaging looked reassuring today. The scan of your abdomen showed a decent amount of stool, so I think constipation could be contributing to your symptoms.  You may do a Miralax bowel cleanout for relief which includes 8 capfulls of Miralax in Gatorade. Drink this in 2 hours on a day where you don't have plans and expect significant bowel movements throughout the day ending in liquid stools. You may continue forward with 1 capfull of Miralax twice a day if difficulties with bowel movements continue.    We've given you one dose of miralax to take with you home and I've sent some to your pharmacy as well.  I sent a request to our transitions of care team to assist you with finding a primary doctor.

## 2021-09-12 NOTE — ED Triage Notes (Signed)
Pt in from home via [facility], per pt he had LUQ pain onset x 1 wk, pt reports going to [facility] ER x 2 this week for this pain, pt denies n/v/d,  pt also reports out of Xanax and going through withdrawals, last med was taken x 2 nights ago at [facility], A&O x4

## 2021-09-12 NOTE — ED Provider Notes (Signed)
[facility] EMERGENCY DEPARTMENT Provider Note   CSN: [id number] Arrival date & time: 09/12/21  1341     History  Chief Complaint  Patient presents with   Abdominal Pain    [name] is a 61 y.o. male.   Abdominal Pain Associated symptoms: no chills, no diarrhea, no fever, no nausea and no vomiting         [name] is a 61 y.o. male with past medical history of generalized anxiety disorder, chronic abdominal pain, prior TIA, and liver disease who presents to the Emergency Department complaining of left upper quadrant pain x1 week.  He also complains of numbness of his left face and right arm.  He states that his left face feels tingly and "feels like my eye is floating in water."  Tingling sensation to the right forearm area.  Denies any weakness of his extremities, speech difficulties headache or dizziness.  States he was seen x 2 recently at [facility] ER for his symptoms and he is unclear regarding the diagnosis.  Describes abdominal pain as "pressure or a knot" to his left upper abdomen.  Pain is nonradiating.  Pain is not associated with nausea vomiting or diarrhea.  No fever or chills.  States that he takes 2 mg Xanax daily and accidentally dropped his Xanax down the sink drain.  He has not had his Xanax since Wednesday, concerned that he may go into withdrawals.  No reported seizures    Home Medications Prior to Admission medications   Medication Sig Start Date End Date Taking? Authorizing Provider  acetaminophen (TYLENOL) 500 MG tablet Take 1,000 mg by mouth every 6 (six) hours as needed.    [provider]  alprazolam Prudy Feeler) 2 MG tablet Take 2 mg by mouth 4 (four) times daily.      [provider]  alprazolam Prudy Feeler) 2 MG tablet Take 1 tablet (2 mg total) by mouth 4 (four) times daily. 09/14/20   Giovanne Nickolson, PA-C  Camphor-Eucalyptus-Menthol (VICKS VAPORUB EX) Apply 1 application topically daily as needed (for neck pain).    [provider]  clonazePAM (KLONOPIN) 0.5 MG tablet Take 1 tablet (0.5 mg total) by mouth 2 (two) times daily as needed for anxiety. 09/16/20   Kommor, [place], MD  DULoxetine (CYMBALTA) 60 MG capsule Take 60 mg by mouth 2 (two) times daily.    [provider]  HYDROcodone-acetaminophen (NORCO) 10-325 MG per tablet Take 1-2 tablets by mouth every 4 (four) hours as needed for moderate pain. For pain    [provider]  Lidocaine (LANSINOH PAIN RELIEF SPRAY EX) Apply 2-3 sprays topically daily as needed (pain).    [provider]  lidocaine (LIDODERM) 5 % Place 1 patch onto the skin daily. Remove & Discard patch within 12 hours or as directed by MD Patient not taking: Reported on 11/22/2017 10/26/17   Long, Arlyss Repress, MD  omeprazole (PRILOSEC) 20 MG capsule Take 20 mg by mouth daily. 09/04/20   [provider]  predniSONE (DELTASONE) 10 MG tablet Take by mouth. Patient not taking: Reported on 09/14/2020    [provider]      Allergies    Risperidone and related, Asa [aspirin], and Penicillins    Review of Systems   Review of Systems  Constitutional:  Negative for appetite change, chills and fever.  Eyes:  Negative for visual disturbance.  Gastrointestinal:  Positive for abdominal pain. Negative for blood in stool, diarrhea, nausea and vomiting.  Musculoskeletal:  Negative for arthralgias and myalgias.  Skin:  Negative for rash.  Neurological:  Positive for numbness (numbness and tingling of left face and right forearm). Negative for seizures, syncope and weakness.  Psychiatric/Behavioral:  Negative for confusion.     Physical Exam Updated Vital Signs BP (!) 152/97 (BP Location: Right Arm)   Pulse 85   Temp 98.5 F (36.9 C) (Oral)   Resp 20   Ht 6' (1.829 m)   SpO2 100%   BMI 23.12 kg/m  Physical Exam Vitals and nursing note reviewed.  Constitutional:      General: He is not in acute distress.    Appearance: He is well-developed. He is  not ill-appearing or toxic-appearing.  Eyes:     Extraocular Movements: Extraocular movements intact.     Conjunctiva/sclera: Conjunctivae normal.     Pupils: Pupils are equal, round, and reactive to light.  Cardiovascular:     Rate and Rhythm: Normal rate.     Pulses: Normal pulses.  Pulmonary:     Effort: Pulmonary effort is normal.     Breath sounds: Normal breath sounds.  Chest:     Chest wall: No tenderness.  Abdominal:     Palpations: Abdomen is soft. There is no mass.     Tenderness: There is abdominal tenderness. There is no guarding.  Musculoskeletal:        General: Normal range of motion.     Cervical back: Normal range of motion.     Right lower leg: No edema.     Left lower leg: No edema.  Skin:    General: Skin is warm.     Capillary Refill: Capillary refill takes less than 2 seconds.     Findings: No erythema or rash.  Neurological:     General: No focal deficit present.     Mental Status: He is alert and oriented to person, place, and time.     GCS: GCS eye subscore is 4. GCS verbal subscore is 5. GCS motor subscore is 6.     Sensory: Sensation is intact. No sensory deficit.     Motor: Motor function is intact. No weakness.     Coordination: Coordination is intact.     Comments: CN II through XII intact.  Speech clear.  No pronator drift.  Facial droop. 5/5 motor strength against resistance of the bilateral upper and lower extremities.     ED Results / Procedures / Treatments   Labs (all labs ordered are listed, but only abnormal results are displayed) Labs Reviewed  COMPREHENSIVE METABOLIC PANEL - Abnormal; Notable for the following components:      Result Value   Sodium 130 (*)    Chloride 95 (*)    Glucose, Bld 114 (*)    All other components within normal limits  LIPASE, BLOOD  CBC  URINALYSIS, ROUTINE W REFLEX MICROSCOPIC    EKG None  Radiology No results found.  Procedures Procedures    Medications Ordered in ED Medications  iohexol  (OMNIPAQUE) 300 MG/ML solution 100 mL (100 mLs Intravenous Contrast Given 09/12/21 1818)    ED Course/ Medical Decision Making/ A&P                           Medical Decision Making Patient here for evaluation of left upper quadrant abdominal pain x1 week, left facial and right arm numbness and tingling seen at another ER facility for same.  States the left side of his  face feels like it is in water.  Abdominal pain is constant, not associated with nausea vomiting fever or chills.  No diarrhea.  On exam, patient is very talkative, vital signs are reassuring.  He has a reassuring neurovascular exam.  I do not appreciate any facial droop, speech changes or extremity weakness.  No prior labs or imaging available on Care Everywhere  Amount and/or Complexity of Data Reviewed Labs: ordered.    Details: Labs no evidence of leukocytosis, lipase unremarkable.  Chemistries show hyponatremia with sodium of 130.  Otherwise unremarkable.  Urinalysis pending Radiology: ordered.    Details: CT of the head and abdomen ordered by me, results pending Discussion of management or test interpretation with external provider(s): Discussed findings with Lorin Roemhildt, PA-C who will review remaining labs and CT results.  I suspect patient's symptoms may be anxiety related, he was given a single dose of p.o. Ativan here.  If CT imaging unremarkable I feel the patient is appropriate for discharge home and he can follow-up with his PCP.  He is also requested resource list for establishing a new primary care provider.  Risk Prescription drug management.           Final Clinical Impression(s) / ED Diagnoses Final diagnoses:  Left upper quadrant abdominal pain    Rx / DC Orders ED Discharge Orders     None         Kem Parkinson, PA-C 09/12/21 1916    Cristie Hem, MD 09/12/21 2241

## 2021-09-14 ENCOUNTER — Emergency Department (HOSPITAL_COMMUNITY): Payer: Medicaid Other

## 2021-09-14 ENCOUNTER — Emergency Department (HOSPITAL_COMMUNITY)
Admission: EM | Admit: 2021-09-14 | Discharge: 2021-09-14 | Disposition: A | Discharge status: Home / Self Care | Payer: Medicaid Other | Attending: Emergency Medicine | Admitting: Emergency Medicine

## 2021-09-14 ENCOUNTER — Other Ambulatory Visit: Payer: Self-pay

## 2021-09-14 ENCOUNTER — Encounter (HOSPITAL_COMMUNITY): Payer: Self-pay | Admitting: Emergency Medicine

## 2021-09-14 DIAGNOSIS — R202 Paresthesia of skin: Secondary | ICD-10-CM | POA: Insufficient documentation

## 2021-09-14 DIAGNOSIS — R200 Anesthesia of skin: Secondary | ICD-10-CM | POA: Diagnosis not present

## 2021-09-14 DIAGNOSIS — K5900 Constipation, unspecified: Secondary | ICD-10-CM | POA: Insufficient documentation

## 2021-09-14 DIAGNOSIS — R0781 Pleurodynia: Secondary | ICD-10-CM | POA: Diagnosis not present

## 2021-09-14 DIAGNOSIS — E871 Hypo-osmolality and hyponatremia: Secondary | ICD-10-CM | POA: Insufficient documentation

## 2021-09-14 DIAGNOSIS — R1084 Generalized abdominal pain: Secondary | ICD-10-CM | POA: Diagnosis present

## 2021-09-14 DIAGNOSIS — R059 Cough, unspecified: Secondary | ICD-10-CM | POA: Diagnosis not present

## 2021-09-14 LAB — CBC WITH DIFFERENTIAL/PLATELET
Abs Immature Granulocytes: 0.03 10*3/uL (ref 0.00–0.07)
Basophils Absolute: 0 10*3/uL (ref 0.0–0.1)
Basophils Relative: 0 %
Eosinophils Absolute: 0 10*3/uL (ref 0.0–0.5)
Eosinophils Relative: 0 %
HCT: 42.8 % (ref 39.0–52.0)
Hemoglobin: 14.7 g/dL (ref 13.0–17.0)
Immature Granulocytes: 0 %
Lymphocytes Relative: 8 %
Lymphs Abs: 0.6 10*3/uL — ABNORMAL LOW (ref 0.7–4.0)
MCH: 29.4 pg (ref 26.0–34.0)
MCHC: 34.3 g/dL (ref 30.0–36.0)
MCV: 85.6 fL (ref 80.0–100.0)
Monocytes Absolute: 0.5 10*3/uL (ref 0.1–1.0)
Monocytes Relative: 6 %
Neutro Abs: 6.2 10*3/uL (ref 1.7–7.7)
Neutrophils Relative %: 86 %
Platelets: 244 10*3/uL (ref 150–400)
RBC: 5 MIL/uL (ref 4.22–5.81)
RDW: 13.4 % (ref 11.5–15.5)
WBC: 7.4 10*3/uL (ref 4.0–10.5)
nRBC: 0 % (ref 0.0–0.2)

## 2021-09-14 LAB — COMPREHENSIVE METABOLIC PANEL
ALT: 20 U/L (ref 0–44)
AST: 32 U/L (ref 15–41)
Albumin: 4.7 g/dL (ref 3.5–5.0)
Alkaline Phosphatase: 67 U/L (ref 38–126)
Anion gap: 12 (ref 5–15)
BUN: 14 mg/dL (ref 8–23)
CO2: 24 mmol/L (ref 22–32)
Calcium: 9.3 mg/dL (ref 8.9–10.3)
Chloride: 90 mmol/L — ABNORMAL LOW (ref 98–111)
Creatinine, Ser: 0.97 mg/dL (ref 0.61–1.24)
GFR, Estimated: >60 mL/min (ref >60)
Glucose, Bld: 132 mg/dL — ABNORMAL HIGH (ref 70–99)
Potassium: 4.1 mmol/L (ref 3.5–5.1)
Sodium: 126 mmol/L — ABNORMAL LOW (ref 135–145)
Total Bilirubin: 1.5 mg/dL — ABNORMAL HIGH (ref 0.3–1.2)
Total Protein: 7.3 g/dL (ref 6.5–8.1)

## 2021-09-14 LAB — MAGNESIUM: Magnesium: 1.9 mg/dL (ref 1.7–2.4)

## 2021-09-14 LAB — TROPONIN I (HIGH SENSITIVITY)
Troponin I (High Sensitivity): <2 ng/L (ref <18)
Troponin I (High Sensitivity): <2 ng/L (ref <18)

## 2021-09-14 LAB — LIPASE, BLOOD: Lipase: 29 U/L (ref 11–51)

## 2021-09-14 MED ORDER — CHLORDIAZEPOXIDE HCL 25 MG PO CAPS
25.0000 mg | ORAL_CAPSULE | Freq: Once | ORAL | Status: AC
  Administered 2021-09-14: 25 mg via ORAL
  Filled 2021-09-14: qty 1

## 2021-09-14 MED ORDER — POLYETHYLENE GLYCOL 3350 17 G PO PACK
68.0000 g | PACK | Freq: Once | ORAL | Status: AC
  Administered 2021-09-14: 68 g via ORAL
  Filled 2021-09-14: qty 4

## 2021-09-14 MED ORDER — MAGNESIUM CITRATE PO SOLN: 1.0000 | Freq: Once | ORAL | 0 refills | Status: AC

## 2021-09-14 MED ORDER — ONDANSETRON 4 MG PO TBDP: 4.0000 mg | ORAL_TABLET | Freq: Three times a day (TID) | ORAL | 0 refills | Status: AC | PRN

## 2021-09-14 NOTE — ED Notes (Signed)
Pt with confirmed living bed bugs.

## 2021-09-14 NOTE — ED Provider Notes (Signed)
[facility] EMERGENCY DEPARTMENT Provider Note   CSN: [id number] Arrival date & time: 09/14/21  4098     History  Chief Complaint  Patient presents with   Abdominal Pain    constipation    [name] is a 61 y.o. male.  HPI Patient presents for multiple complaints.  Medical history includes anxiety, IBS, TIA, and arthritis.  He was seen in the ED 2 days ago for left upper quadrant pain and numbness and tingling sensations.  At the time, he had not had his home Xanax in several days.  He was concerned of withdrawals.  He was given a dose of Ativan.  He underwent labs, CT of head, and CT of abdomen and pelvis.  CT of abdomen pelvis showed moderate stool burden.  He was advised to take MiraLAX.  He states that he attempted to take a dose of MiraLAX yesterday morning.  He subsequently had a coughing spell and this caused left rib pain.  He has had ongoing lower left rib pain since that time.  He still has not had a bowel movement in approximately 1 week.  He has some generalized abdominal pain that he attributes to his constipation.  He also endorses migrating numbness and paresthesias in the area of his face.  Patient arrives via EMS.  EMS noted normal blood glucose prior to arrival.    Home Medications Prior to Admission medications   Medication Sig Start Date End Date Taking? Authorizing Provider  magnesium citrate SOLN Take 296 mLs (1 Bottle total) by mouth once for 1 dose. 09/14/21 09/14/21 Yes Gloris Manchester, MD  ondansetron (ZOFRAN-ODT) 4 MG disintegrating tablet Take 1 tablet (4 mg total) by mouth every 8 (eight) hours as needed for nausea or vomiting. 09/14/21  Yes Gloris Manchester, MD  acetaminophen (TYLENOL) 500 MG tablet Take 1,000 mg by mouth every 6 (six) hours as needed.    [provider]  alprazolam Prudy Feeler) 2 MG tablet Take 2 mg by mouth 4 (four) times daily.      [provider]  alprazolam Prudy Feeler) 2 MG tablet Take 1 tablet (2 mg total) by mouth 4 (four) times  daily. 09/14/20   Triplett, Tammy, PA-C  Camphor-Eucalyptus-Menthol (VICKS VAPORUB EX) Apply 1 application topically daily as needed (for neck pain).    [provider]  clonazePAM (KLONOPIN) 0.5 MG tablet Take 1 tablet (0.5 mg total) by mouth 2 (two) times daily as needed for anxiety. 09/16/20   Kommor, Madison, MD  DULoxetine (CYMBALTA) 60 MG capsule Take 60 mg by mouth 2 (two) times daily.    [provider]  HYDROcodone-acetaminophen (NORCO) 10-325 MG per tablet Take 1-2 tablets by mouth every 4 (four) hours as needed for moderate pain. For pain    [provider]  Lidocaine (LANSINOH PAIN RELIEF SPRAY EX) Apply 2-3 sprays topically daily as needed (pain).    [provider]  lidocaine (LIDODERM) 5 % Place 1 patch onto the skin daily. Remove & Discard patch within 12 hours or as directed by MD Patient not taking: Reported on 11/22/2017 10/26/17   Long, Arlyss Repress, MD  omeprazole (PRILOSEC) 20 MG capsule Take 20 mg by mouth daily. 09/04/20   [provider]  predniSONE (DELTASONE) 10 MG tablet Take by mouth. Patient not taking: Reported on 09/14/2020    [provider]      Allergies    Risperidone and related, Asa [aspirin], and Penicillins    Review of Systems   Review of  Systems  Cardiovascular:  Positive for chest pain (Left lower ribs).  Gastrointestinal:  Positive for abdominal pain and constipation.  Neurological:  Positive for numbness.  All other systems reviewed and are negative.   Physical Exam Updated Vital Signs BP (!) 158/89   Pulse 85   Temp 98.8 F (37.1 C) (Oral)   Resp 16   Ht 6' (1.829 m)   Wt 77 kg   SpO2 98%   BMI 23.02 kg/m  Physical Exam Vitals and nursing note reviewed.  Constitutional:      General: He is not in acute distress.    Appearance: He is well-developed and normal weight. He is not ill-appearing, toxic-appearing or diaphoretic.  HENT:     Head: Normocephalic and atraumatic.     Mouth/Throat:      Mouth: Mucous membranes are moist.     Pharynx: Oropharynx is clear.  Eyes:     Conjunctiva/sclera: Conjunctivae normal.     Pupils: Pupils are equal, round, and reactive to light.  Cardiovascular:     Rate and Rhythm: Normal rate and regular rhythm.     Heart sounds: No murmur heard. Pulmonary:     Effort: Pulmonary effort is normal. No respiratory distress.     Breath sounds: Normal breath sounds. No wheezing or rales.  Chest:     Chest wall: Tenderness present.  Abdominal:     Palpations: Abdomen is soft.     Tenderness: There is generalized abdominal tenderness. There is no guarding or rebound.  Musculoskeletal:        General: No swelling.     Cervical back: Neck supple.  Skin:    General: Skin is warm and dry.     Capillary Refill: Capillary refill takes less than 2 seconds.  Neurological:     General: No focal deficit present.     Mental Status: He is alert and oriented to person, place, and time.     Cranial Nerves: Cranial nerves 2-12 are intact. No cranial nerve deficit, dysarthria or facial asymmetry.     Sensory: Sensation is intact. No sensory deficit.     Motor: Motor function is intact. No weakness or abnormal muscle tone.     Coordination: Coordination is intact.  Psychiatric:        Mood and Affect: Mood is anxious.        Speech: Speech normal.        Behavior: Behavior normal. Behavior is cooperative.     ED Results / Procedures / Treatments   Labs (all labs ordered are listed, but only abnormal results are displayed) Labs Reviewed  COMPREHENSIVE METABOLIC PANEL - Abnormal; Notable for the following components:      Result Value   Sodium 126 (*)    Chloride 90 (*)    Glucose, Bld 132 (*)    Total Bilirubin 1.5 (*)    All other components within normal limits  CBC WITH DIFFERENTIAL/PLATELET - Abnormal; Notable for the following components:   Lymphs Abs 0.6 (*)    All other components within normal limits  LIPASE, BLOOD  MAGNESIUM  TROPONIN I  (HIGH SENSITIVITY)  TROPONIN I (HIGH SENSITIVITY)    EKG None  Radiology DG Abd Acute W/Chest  Result Date: 09/14/2021 CLINICAL DATA:  Left lower chest wall and abdominal pain. Constipation. EXAM: DG ABDOMEN ACUTE WITH 1 VIEW CHEST COMPARISON:  09/14/2020.  CT, 09/12/2021. FINDINGS: Normal bowel gas pattern. Mild generalized increase in the colonic stool burden. No bowel air-fluid levels or free  air. No evidence of renal or ureteral stones. Abdominopelvic soft tissues are unremarkable. Cardiac silhouette normal in size. No mediastinal or hilar masses. Lungs essentially clear. No acute skeletal abnormality. IMPRESSION: 1. No acute findings.  No evidence of bowel obstruction or free air. 2. Mild generalized increase in the colonic stool burden. 3. No active cardiopulmonary disease. Electronically Signed   By: Amie Portland M.D.   On: 09/14/2021 09:59   CT ABDOMEN PELVIS W CONTRAST  Result Date: 09/12/2021 CLINICAL DATA:  Left lower and left upper quadrant pain. EXAM: CT ABDOMEN AND PELVIS WITH CONTRAST TECHNIQUE: Multidetector CT imaging of the abdomen and pelvis was performed using the standard protocol following bolus administration of intravenous contrast. RADIATION DOSE REDUCTION: This exam was performed according to the departmental dose-optimization program which includes automated exposure control, adjustment of the mA and/or kV according to patient size and/or use of iterative reconstruction technique. CONTRAST:  OMNIPAQUE IOHEXOL 300 MG/ML  SOLN COMPARISON:  Most recent CT available 03/04/2021 FINDINGS: Lower chest: No acute findings. Linear scarring in the left lower lobe. Heart is normal in size. Hepatobiliary: Mild subjective hepatic steatosis. Stable cysts in the liver, needing no further follow-up imaging. No suspicious hepatic lesion. Gallbladder physiologically distended, no calcified stone. No biliary dilatation. Pancreas: 12 mm well-defined cyst in the pancreatic head, series 3,  image 33. This is retrospectively unchanged from prior exam. No ductal dilatation or inflammation. Spleen: Normal in size without focal abnormality. Adrenals/Urinary Tract: No adrenal nodule. No hydronephrosis or perinephric edema. Homogeneous renal enhancement with symmetric excretion on delayed phase imaging. No renal calculi or solid lesion. Urinary bladder is partially distended without wall thickening. Stomach/Bowel: Detailed bowel assessment is limited in the absence of enteric contrast. Stomach is decompressed. No small bowel obstruction or evident inflammation. Appendix not visualized, appendectomy per history. Moderate volume of colonic stool. No colonic inflammation. Vascular/Lymphatic: Minimal aortic atherosclerosis. No aortic aneurysm. Circumaortic left renal vein. No suspicious adenopathy. Reproductive: Prostate is unremarkable. Other: No ascites or free air.  No abdominal wall hernia. Musculoskeletal: Mild scattered degenerative disc disease in the lumbar spine. There are no acute or suspicious osseous abnormalities. IMPRESSION: 1. No acute abnormality in the abdomen/pelvis. 2. Moderate volume of colonic stool, can be seen with constipation. 3. Stable 12 mm cyst in the pancreatic head since February 2023 CT. Consensus guidelines recommendation for a cyst of this size recommends imaging yearly for 5 years to ensure stability. Recommend initial follow-up pancreatic protocol CT or MRI in 1 year. 4. Mild hepatic steatosis. Aortic Atherosclerosis (ICD10-I70.0). Electronically Signed   By: Narda Rutherford M.D.   On: 09/12/2021 18:52   CT Head Wo Contrast  Result Date: 09/12/2021 CLINICAL DATA:  Pain nausea vomiting diarrhea EXAM: CT HEAD WITHOUT CONTRAST TECHNIQUE: Contiguous axial images were obtained from the base of the skull through the vertex without intravenous contrast. RADIATION DOSE REDUCTION: This exam was performed according to the departmental dose-optimization program which includes  automated exposure control, adjustment of the mA and/or kV according to patient size and/or use of iterative reconstruction technique. COMPARISON:  Brain MRI 08/19/2015, CT brain 01/31/2014 FINDINGS: Brain: No acute territorial infarction, hemorrhage or intracranial mass. Atrophy and mild chronic small vessel ischemic changes of the white matter. The ventricles are nonenlarged Vascular: No hyperdense vessels.  Carotid vascular calcification Skull: Normal. Negative for fracture or focal lesion. Sinuses/Orbits: No acute finding. Other: None IMPRESSION: 1. No CT evidence for acute intracranial abnormality. 2. Mild atrophy and chronic small vessel ischemic changes of the  white matter Electronically Signed   By: Jasmine Pang M.D.   On: 09/12/2021 18:47    Procedures Procedures    Medications Ordered in ED Medications  polyethylene glycol (MIRALAX / GLYCOLAX) packet 68 g (68 g Oral Given 09/14/21 1031)  chlordiazePOXIDE (LIBRIUM) capsule 25 mg (25 mg Oral Given 09/14/21 1221)    ED Course/ Medical Decision Making/ A&P                           Medical Decision Making Amount and/or Complexity of Data Reviewed Labs: ordered. Radiology: ordered.  Risk OTC drugs. Prescription drug management.   This patient presents to the ED for concern of multiple complaints, this involves an extensive number of treatment options, and is a complaint that carries with it a high risk of complications and morbidity.  The differential diagnosis includes anxiety, benzodiazepine withdrawal, hyperventilation, constipation, acute rib injury, ACS   Co morbidities that complicate the patient evaluation  anxiety, IBS, TIA, and arthritis   Additional history obtained:  Additional history obtained from N/A External records from outside source obtained and reviewed including EMR   Lab Tests:  I Ordered, and personally interpreted labs.  The pertinent results include: Mild hyponatremia with otherwise normal  electrolytes, normal kidney function, normal hemoglobin, no leukocytosis, normal troponin normal lipase   Imaging Studies ordered:  I ordered imaging studies including x-ray of chest and abdomen I independently visualized and interpreted imaging which showed increased stool burden I agree with the radiologist interpretation   Cardiac Monitoring: / EKG:  The patient was maintained on a cardiac monitor.  I personally viewed and interpreted the cardiac monitored which showed an underlying rhythm of: Sinus rhythm    Problem List / ED Course / Critical interventions / Medication management  Patient presents for multiple complaints.  Among these are constipation for the past week.  He was seen in the ED 2 days ago.  At that time, he underwent a CT scan of the abdomen and pelvis which showed a moderate stool burden.  He was advised take MiraLAX.  He states that he was unable to take his dose of MiraLAX yesterday due to a coughing spell which subsequently caused rib pain on the left anterior aspect of his chest wall.  He has had ongoing pain in this area.  Patient also endorses perioral numbness and paresthesias, which has been an ongoing complaint for him.  On exam, he is well-appearing.  He appears anxious.  His abdomen is soft without any guarding or rebound.  He does endorse some generalized abdominal tenderness.  Left anterior lower rib tenderness is present as well.  Breathing is unlabored and lungs are clear to auscultation.  Lab work and acute chest series x-ray was ordered.  X-ray showed increased total burden.  Laboratory work-up was notable for mild hyponatremia.  Patient was encouraged to eat while in the ED.  On DRE, patient has no impacted stool.  He was able to tolerate MiraLAX in the ED.  He was prescribed magnesium citrate for further management of his constipation.  Patient also was prescribed Xanax and was previously taking it 4 times per day.  He has not taken over the past 3 days.   This may be contributing to his anxiety.  Librium was ordered for empiric treatment of mild withdrawal symptoms.  Patient had improved symptoms was able to tolerate p.o. intake in the ED.  He was discharged in stable condition. I ordered medication  including Librium for anxiety from benzodiazepine withdrawal, MiraLAX for constipation Reevaluation of the patient after these medicines showed that the patient improved I have reviewed the patients home medicines and have made adjustments as needed   Social Determinants of Health:  Frequent ED visits         Final Clinical Impression(s) / ED Diagnoses Final diagnoses:  Constipation, unspecified constipation type    Rx / DC Orders ED Discharge Orders          Ordered    magnesium citrate SOLN   Once        09/14/21 1021    ondansetron (ZOFRAN-ODT) 4 MG disintegrating tablet  Every 8 hours PRN        09/14/21 1234              Gloris Manchester, MD 09/14/21 1645

## 2021-09-14 NOTE — Discharge Instructions (Addendum)
Continue to take daily MiraLAX.  Additionally, a prescription for magnesium citrate was sent to your pharmacy.  This will also help with constipation.  Zofran was sent to your pharmacy as well.  This is a medication that can treat nausea.  Take this only as needed.  Follow-up with your primary care doctor for ongoing management of your chronic symptoms and management of your daily medications.

## 2021-09-14 NOTE — ED Triage Notes (Signed)
Pt was in [facility] on 9/1 for abd pain and constipation. Pt was dc with stool softeners and miralax. Pt was non-compliant with meds prescribed. Pt presents today with c/o left facial numbness and weakness. Pts symmetry is equal upon assessment. Pt also c/o abdominal pain, continued constipation and states his LBM was +1 week. Pt c/o cough and pain while coughing.

## 2021-09-16 ENCOUNTER — Emergency Department (HOSPITAL_COMMUNITY)
Admission: EM | Admit: 2021-09-16 | Discharge: 2021-09-19 | Disposition: A | Discharge status: Home / Self Care | Payer: Medicaid Other | Attending: Emergency Medicine | Admitting: Emergency Medicine

## 2021-09-16 ENCOUNTER — Encounter (HOSPITAL_COMMUNITY): Payer: Self-pay

## 2021-09-16 ENCOUNTER — Other Ambulatory Visit: Payer: Self-pay

## 2021-09-16 DIAGNOSIS — F22 Delusional disorders: Secondary | ICD-10-CM | POA: Diagnosis not present

## 2021-09-16 DIAGNOSIS — F419 Anxiety disorder, unspecified: Secondary | ICD-10-CM | POA: Diagnosis not present

## 2021-09-16 DIAGNOSIS — R7989 Other specified abnormal findings of blood chemistry: Secondary | ICD-10-CM | POA: Diagnosis not present

## 2021-09-16 DIAGNOSIS — R4182 Altered mental status, unspecified: Secondary | ICD-10-CM | POA: Diagnosis present

## 2021-09-16 DIAGNOSIS — R000 Tachycardia, unspecified: Secondary | ICD-10-CM | POA: Diagnosis not present

## 2021-09-16 DIAGNOSIS — F13239 Sedative, hypnotic or anxiolytic dependence with withdrawal, unspecified: Secondary | ICD-10-CM | POA: Diagnosis not present

## 2021-09-16 DIAGNOSIS — K5900 Constipation, unspecified: Secondary | ICD-10-CM | POA: Diagnosis not present

## 2021-09-16 DIAGNOSIS — F13939 Sedative, hypnotic or anxiolytic use, unspecified with withdrawal, unspecified: Secondary | ICD-10-CM

## 2021-09-16 LAB — URINALYSIS, ROUTINE W REFLEX MICROSCOPIC
Bilirubin Urine: NEGATIVE
Glucose, UA: NEGATIVE mg/dL
Hgb urine dipstick: NEGATIVE
Ketones, ur: 20 mg/dL — AB
Leukocytes,Ua: NEGATIVE
Nitrite: NEGATIVE
Protein, ur: 100 mg/dL — AB
Specific Gravity, Urine: 1.031 — ABNORMAL HIGH (ref 1.005–1.030)
pH: 5 (ref 5.0–8.0)

## 2021-09-16 LAB — COMPREHENSIVE METABOLIC PANEL
ALT: 25 U/L (ref 0–44)
AST: 40 U/L (ref 15–41)
Albumin: 5 g/dL (ref 3.5–5.0)
Alkaline Phosphatase: 76 U/L (ref 38–126)
Anion gap: 13 (ref 5–15)
BUN: 28 mg/dL — ABNORMAL HIGH (ref 8–23)
CO2: 23 mmol/L (ref 22–32)
Calcium: 9.8 mg/dL (ref 8.9–10.3)
Chloride: 97 mmol/L — ABNORMAL LOW (ref 98–111)
Creatinine, Ser: 1.45 mg/dL — ABNORMAL HIGH (ref 0.61–1.24)
GFR, Estimated: 54 mL/min — ABNORMAL LOW (ref >60)
Glucose, Bld: 123 mg/dL — ABNORMAL HIGH (ref 70–99)
Potassium: 4 mmol/L (ref 3.5–5.1)
Sodium: 133 mmol/L — ABNORMAL LOW (ref 135–145)
Total Bilirubin: 1.1 mg/dL (ref 0.3–1.2)
Total Protein: 8 g/dL (ref 6.5–8.1)

## 2021-09-16 LAB — CBC
HCT: 47.1 % (ref 39.0–52.0)
Hemoglobin: 15.9 g/dL (ref 13.0–17.0)
MCH: 29.7 pg (ref 26.0–34.0)
MCHC: 33.8 g/dL (ref 30.0–36.0)
MCV: 88 fL (ref 80.0–100.0)
Platelets: 277 10*3/uL (ref 150–400)
RBC: 5.35 MIL/uL (ref 4.22–5.81)
RDW: 14 % (ref 11.5–15.5)
WBC: 7.9 10*3/uL (ref 4.0–10.5)
nRBC: 0 % (ref 0.0–0.2)

## 2021-09-16 LAB — RAPID URINE DRUG SCREEN, HOSP PERFORMED
Amphetamines: NOT DETECTED
Barbiturates: NOT DETECTED
Benzodiazepines: POSITIVE — AB
Cocaine: NOT DETECTED
Opiates: NOT DETECTED
Tetrahydrocannabinol: NOT DETECTED

## 2021-09-16 LAB — CBG MONITORING, ED: Glucose-Capillary: 121 mg/dL — ABNORMAL HIGH (ref 70–99)

## 2021-09-16 MED ORDER — POLYETHYLENE GLYCOL 3350 17 G PO PACK
17.0000 g | PACK | Freq: Once | ORAL | Status: AC
  Administered 2021-09-16: 17 g via ORAL
  Filled 2021-09-16: qty 1

## 2021-09-16 MED ORDER — POLYETHYLENE GLYCOL 3350 17 G PO PACK: 17.0000 g | PACK | Freq: Every day | ORAL | Status: DC | PRN

## 2021-09-16 MED ORDER — PANTOPRAZOLE SODIUM 40 MG PO TBEC
40.0000 mg | DELAYED_RELEASE_TABLET | Freq: Every day | ORAL | Status: DC
  Administered 2021-09-16 – 2021-09-19 (×3): 40 mg via ORAL
  Filled 2021-09-16 (×4): qty 1

## 2021-09-16 MED ORDER — SODIUM CHLORIDE 0.9 % IV BOLUS
1000.0000 mL | Freq: Once | INTRAVENOUS | Status: AC
Start: 2021-09-16 — End: 2021-09-16
  Administered 2021-09-16: 1000 mL via INTRAVENOUS

## 2021-09-16 MED ORDER — LORAZEPAM 1 MG PO TABS
1.0000 mg | ORAL_TABLET | Freq: Four times a day (QID) | ORAL | Status: DC | PRN
  Administered 2021-09-16 – 2021-09-19 (×5): 1 mg via ORAL
  Filled 2021-09-16 (×5): qty 1

## 2021-09-16 MED ORDER — DULOXETINE HCL 30 MG PO CPEP
60.0000 mg | ORAL_CAPSULE | Freq: Two times a day (BID) | ORAL | Status: DC
  Administered 2021-09-16 – 2021-09-19 (×6): 60 mg via ORAL
  Filled 2021-09-16 (×7): qty 2

## 2021-09-16 NOTE — ED Notes (Signed)
Patient belongings as follows:  1: Keys on keychain 1: Ziploc bag of various prescription medications.   Belongings secured in pt belonging room.
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
## 2021-09-16 NOTE — ED Triage Notes (Signed)
Pt presents to ED without shirt and barefoot, states law enforcement dropped him off. Pt states he was seen last night for abdominal pain. Pt was seen on 9/3. Pt a&o x3. Pt states he thought he was at the funeral home but was actually at the car lot so law enforcement gave him a ride up here to be seen. Pt states he has no transportation home. Pt disheveled. Pt states he hasn't been home in 2 nights and not had the chance to get his medications from the drug store.

## 2021-09-16 NOTE — ED Notes (Addendum)
Patient in bed states that something is crawling in his mouth. Explained to patient that he didn't have anything in his mouth.Patient continues to repeat something is not right. Pointing to another patient. Redirection successful.

## 2021-09-16 NOTE — ED Notes (Signed)
Pt will look off in the distance and state "That's an ugly ass picture right there, that skull with those teeth, that's ugly."

## 2021-09-16 NOTE — ED Notes (Signed)
Patient given sandwich, chips and juice

## 2021-09-17 MED ORDER — ONDANSETRON 4 MG PO TBDP: 4.0000 mg | ORAL_TABLET | Freq: Three times a day (TID) | ORAL | Status: DC | PRN

## 2021-09-17 MED ORDER — METOPROLOL SUCCINATE ER 25 MG PO TB24
25.0000 mg | ORAL_TABLET | Freq: Every day | ORAL | Status: DC
  Administered 2021-09-17 – 2021-09-19 (×2): 25 mg via ORAL
  Filled 2021-09-17 (×3): qty 1

## 2021-09-17 MED ORDER — ZIPRASIDONE HCL 20 MG PO CAPS: 20.0000 mg | ORAL_CAPSULE | Freq: Two times a day (BID) | ORAL | Status: DC | PRN

## 2021-09-17 MED ORDER — STERILE WATER FOR INJECTION IJ SOLN
INTRAMUSCULAR | Status: AC
  Filled 2021-09-17: qty 10

## 2021-09-17 MED ORDER — ZIPRASIDONE MESYLATE 20 MG IM SOLR: 20.0000 mg | Freq: Once | INTRAMUSCULAR | Status: AC

## 2021-09-17 MED ORDER — CLONAZEPAM 0.5 MG PO TABS
0.5000 mg | ORAL_TABLET | Freq: Two times a day (BID) | ORAL | Status: DC | PRN
  Administered 2021-09-17 – 2021-09-19 (×4): 0.5 mg via ORAL
  Filled 2021-09-17 (×4): qty 1

## 2021-09-17 MED ORDER — ZIPRASIDONE MESYLATE 20 MG IM SOLR
INTRAMUSCULAR | Status: AC
  Administered 2021-09-17: 20 mg via INTRAMUSCULAR
  Filled 2021-09-17: qty 20

## 2021-09-17 MED ORDER — ACETAMINOPHEN 500 MG PO TABS
1000.0000 mg | ORAL_TABLET | Freq: Four times a day (QID) | ORAL | Status: DC | PRN
Start: 2021-09-17 — End: 2021-09-19
  Administered 2021-09-19 (×2): 1000 mg via ORAL
  Filled 2021-09-17 (×2): qty 2

## 2021-09-17 NOTE — ED Notes (Signed)
Pt took medication w/o incident.  Pt continues to have flight of ideas.
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
## 2021-09-17 NOTE — ED Notes (Signed)
Sitting in chair. Alert, NAD, calm. Pending AVS, d/c, and coordinating transportation

## 2021-09-17 NOTE — ED Notes (Addendum)
Pt doesn't know where he is. He points to the garage door in the room and tells me that it is his desk and bedroom there. He does know month and year. He has no idea why or who brought him here, but then changes his mind and says he had surgery yesterday and had blood work done and that's what brought him here. Police brought him in because they found him wandering around town after we discharged him yesterday. He was dishelved when found. Nursing staff is concerned for pt's safety upon discharge. We are concerned he is not able to care for himself being that he is confused. He also tells [place] that if we "flip two of those rooms" (referring to patient rooms in the ED) "it will lead to an underground tunnel". Dr. Durwin Nora and SW made aware. BH NP has been requested to reassess pt per Dr. Durwin Nora.

## 2021-09-17 NOTE — ED Notes (Signed)
Pt continuing to come out of room with flights of ideas, trying to enter other patients rooms not easily redirectable

## 2021-09-17 NOTE — ED Notes (Signed)
Pt ambulatory to restroom w/o assistance 

## 2021-09-17 NOTE — ED Notes (Addendum)
[name] with TTS requested that CSW provide pt with information for outpatient mental health services in [place]. CSW added [facility] mental health services information to patients AVS documentation. Also, CSW reached out to [address], [place], [place], [place], [place] and [place] regarding [facility] services. There is no company able to accept pts referral at this time. Per chart review pt is independent with ambulation. TOC signing off.

## 2021-09-17 NOTE — ED Notes (Signed)
Pt is cussing at something in his room and keep coming out trying to go in other pt room.

## 2021-09-17 NOTE — ED Notes (Signed)
Pt continues to have flight of ideas.  Pt noted to be whispering to staff.  Pt told this RN "there is an ATM in the back of that truck.  You need to keep the children away from it."

## 2021-09-17 NOTE — ED Notes (Signed)
pt may need something for increased agitation, inability to follow simple directives multiple times by multiple people, more difficult to de-escalate, scoring high violence potential scores, wandering into hall, into others rooms, staring into others rooms. Asked to remain in room. Sitter present. Ativan given at 1600.

## 2021-09-17 NOTE — ED Notes (Signed)
Per night shift RN, Pt has not slept all night.  Pt noted to have flight of ideas.

## 2021-09-17 NOTE — ED Notes (Signed)
Pleasantly confused. Continues to wander and push boundaries intermittently.

## 2021-09-17 NOTE — ED Notes (Signed)
OK to give Geodon 20mg  IM. Verbal order received. V.o.v.

## 2021-09-17 NOTE — ED Provider Notes (Signed)
Emergency Medicine Observation Re-evaluation Note  [name] is a 61 y.o. male, seen on rounds today.  Pt initially presented to the ED for complaints of Altered Mental Status Currently, the patient is resting on stretcher.  Physical Exam  BP 138/77 (BP Location: Left Arm)   Pulse 88   Temp (!) 97 F (36.1 C) (Axillary)   Resp 20   Ht 6' (1.829 m)   Wt 74.6 kg   SpO2 94%   BMI 22.30 kg/m  Physical Exam General: Awake and alert Cardiac: Extremities well-perfused Lungs: Breathing is unlabored Psych: Mildly anxious  ED Course / MDM  EKG:EKG Interpretation  Date/Time:  Tuesday September 16 2021 10:17:57 EDT Ventricular Rate:  112 PR Interval:  156 QRS Duration: 82 QT Interval:  324 QTC Calculation: 442 R Axis:   74 Text Interpretation: Sinus tachycardia Biatrial enlargement Abnormal ECG When compared with ECG of 16-Sep-2021 10:16, No significant change was found Since last tracing rate faster Confirmed by Jacalyn Lefevre [phone]) on 09/16/2021 1:52:25 PM  I have reviewed the labs performed to date as well as medications administered while in observation.  Recent changes in the last 24 hours include presentation to the emergency department yesterday with delusional thoughts and flight of ideas.  Currently awaiting TTS evaluation.  Plan  Current plan is for TTS evaluation.    Gloris Manchester, MD 09/17/21 [phone]

## 2021-09-17 NOTE — ED Notes (Addendum)
Pt unable to follow commands or verbally re-direct, or verbally de-escalate. Attempted to give space. Offered for him to enter his room multiple times. Remains in h/w, outside of two other pts rooms, which he has tried to enter and peer into multiple times. Pt began swinging and kicking. Therapeutic hold by multiple staff applied. Taken to his room. Security, CN, and EDP notified and responded.

## 2021-09-18 DIAGNOSIS — F22 Delusional disorders: Secondary | ICD-10-CM

## 2021-09-18 DIAGNOSIS — F13939 Sedative, hypnotic or anxiolytic use, unspecified with withdrawal, unspecified: Secondary | ICD-10-CM

## 2021-09-18 MED ORDER — ZIPRASIDONE MESYLATE 20 MG IM SOLR
20.0000 mg | Freq: Once | INTRAMUSCULAR | Status: DC
  Filled 2021-09-18: qty 20

## 2021-09-18 MED ORDER — STERILE WATER FOR INJECTION IJ SOLN
INTRAMUSCULAR | Status: AC
  Filled 2021-09-18: qty 10
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
## 2021-09-18 NOTE — ED Notes (Signed)
Patient has wandered out of room 2-3 times thus far this shift requiring re-direction by sitter. Unable to answer questions appropriately. When asked where he was going he states "I'm going to get a quote out here for car insurance" pointing to nurses station. Patient appears easily agitated.

## 2021-09-18 NOTE — ED Provider Notes (Signed)
Emergency Medicine Observation Re-evaluation Note  [name] is a 61 y.o. male, seen on rounds today.  Pt initially presented to the ED for complaints of Altered Mental Status Currently, the patient is resting quietly in bed.  Physical Exam  BP 128/84 (BP Location: Left Arm)   Pulse 65   Temp 98.4 F (36.9 C)   Resp 20   Ht 6' (1.829 m)   Wt 74.6 kg   SpO2 95%   BMI 22.30 kg/m  Physical Exam General: No acute distress Cardiac: Well-perfused Lungs: Nonlabored Psych: Redirectable  ED Course / MDM  EKG:EKG Interpretation  Date/Time:  Tuesday September 16 2021 10:17:57 EDT Ventricular Rate:  112 PR Interval:  156 QRS Duration: 82 QT Interval:  324 QTC Calculation: 442 R Axis:   74 Text Interpretation: Sinus tachycardia Biatrial enlargement Abnormal ECG When compared with ECG of 16-Sep-2021 10:16, No significant change was found Since last tracing rate faster Confirmed by Jacalyn Lefevre [phone]) on 09/16/2021 1:52:25 PM  I have reviewed the labs performed to date as well as medications administered while in observation.  Recent changes in the last 24 hours include TTS evaluation and psychiatrically cleared.  Social work following and working on getting him outpatient follow-up.  Plan  Current plan is for TTS reevaluation as patient does not appear to have capacity to take care of himself.Terrilee Files, MD 09/18/21 [phone]

## 2021-09-18 NOTE — ED Notes (Signed)
TTS at this time. 

## 2021-09-18 NOTE — ED Notes (Signed)
Pt is awake and sitting in bed eating lunch at this time.

## 2021-09-18 NOTE — ED Notes (Signed)
Pt is asleep, breakfast tray provided and at bedside.

## 2021-09-18 NOTE — ED Notes (Signed)
Pt. Lunch tray is at bedside.

## 2021-09-18 NOTE — Progress Notes (Signed)
Per Cecilie Lowers, FNP pt does not meet criteria for inpatient behavioral health placement. TOC to assist with pt's discharge needs. CSW will now remove pt from the [facility] shift report.  Maryjean Ka, MSW, LCSWA 09/18/2021 10:01 PM

## 2021-09-18 NOTE — ED Notes (Signed)
CSW noted per Alan Mulder, FNP note pt lacks capacity. CSW made call to [facility] APS. CSW made report to [facility]. CSW awaiting call back on if case will be taken. If case is accepted, pt will need to be established a legal guardian and placement will need to be found. CSW awaiting updated from [facility].

## 2021-09-18 NOTE — ED Notes (Signed)
Patient refused PO meds earlier. Laid self back down in bed and covered head. Would not verbally respond to this nurse.

## 2021-09-19 NOTE — ED Notes (Signed)
Preparing for d/c.

## 2021-09-19 NOTE — ED Notes (Signed)
EDP at [facility] rounding

## 2021-09-19 NOTE — ED Notes (Signed)
Resting reclined in stretcher, in dimly lit room, sitter present.

## 2021-09-19 NOTE — Discharge Instructions (Addendum)
You were seen at [facility] Emergency Department. You were evaluated initially for abdominal pain and concern for benzodiazepine withdrawal. You also had a psychiatric eval and were determined to have capacity to make your own decisions. Please follow up with your primary care doctor in 1 week.   Please do not hesitate to call 911 or come to the ED if you have any concerns, including worsening pain, loss of consciousness, fevers/chills, or any other concerns.

## 2021-09-19 NOTE — Progress Notes (Addendum)
Transition of Care [facility]) - Emergency Department Mini Assessment   Patient Details  Name: [name] MRN: [id number] Date of Birth: [birth year]  Transition of Care [facility]) CM/SW Contact:    Villa Herb, LCSWA Phone Number: 09/19/2021, 1:54 PM   Clinical Narrative: CSW received update from [name] caseworker assigned to pts case with [name]. Per [name] pt answered every question on his MMSE correctly. APS will not be filing for guardianship at this time. [name] states that he will continue to follow pt in the community to assist with getting him needed assistance. CSW updated ED staff of this information. Per [name] with APS pt is cleared to return to the community and they will continue following. TOC signing off.   ED Mini Assessment: What brought you to the Emergency Department? 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   Lightheadedness 11/12/2015   White matter abnormality on MRI of brain 11/12/2015   Left facial numbness 08/08/2015   TIA (transient ischemic attack) 08/08/2015   Cervical spondylosis with radiculopathy 05/28/2014   Altered mental status 08/23/2013   Aphasia 08/23/2013   IBS (irritable bowel syndrome) 11/14/2012   Weight loss 06/13/2012   Abdominal pain, epigastric 04/11/2012   DJD (degenerative joint disease) 04/11/2012   Generalized anxiety disorder 04/11/2012   PCP:  [name], MD Pharmacy:   [name] - [place], [place] - [address] [place] [address] Phone: [phone] Fax: [phone]  [id number] - [place], [place] - [address] AT [facility] [address] [place] [address] Phone: [phone] Fax: [phone]  [facility] - [place], [place] - [address] [place] [address] Phone: [phone] Fax: [phone]

## 2021-09-19 NOTE — ED Provider Notes (Signed)
Emergency Medicine Observation Re-evaluation Note  [name] is a 61 y.o. male, seen on rounds today.  Pt initially presented to the ED for complaints of Altered Mental Status Currently, the patient is lying in bed, tearful about wanting to leave.   Physical Exam  BP 132/88 (BP Location: Left Arm)   Pulse 76   Temp 98.6 F (37 C) (Oral)   Resp 18   Ht 6' (1.829 m)   Wt 74.6 kg   SpO2 98%   BMI 22.30 kg/m  Physical Exam General: Lying in bed, tearful Cardiac: Well-perfused Lungs: No increased work of breathing Psych: Tearful, sad affect.  Minimally engaging in interview.  ED Course / MDM  EKG:EKG Interpretation  Date/Time:  Tuesday September 16 2021 10:17:57 EDT Ventricular Rate:  112 PR Interval:  156 QRS Duration: 82 QT Interval:  324 QTC Calculation: 442 R Axis:   74 Text Interpretation: Sinus tachycardia Biatrial enlargement Abnormal ECG When compared with ECG of 16-Sep-2021 10:16, No significant change was found Since last tracing rate faster Confirmed by [name] [phone]) on 09/16/2021 1:52:25 PM  I have reviewed the labs performed to date as well as medications administered while in observation.  Recent changes in the last 24 hours per chart review include: FNP notes that patient lacks capacity, pt does not meet criteria for inpatient behavioral health placement. TOC to assist with pt's discharge needs.CSW awaiting call back on if case will be taken. If case is accepted, pt will need to be established a legal guardian and placement will need to be found. CSW awaiting updated from [facility] APS.   Plan   Current plan is to await TOC and CSW updates and placement for patient.     Alan Rough, MD 09/19/21 [phone]

## 2021-09-19 NOTE — ED Notes (Signed)
Alert, NAD, calmer, mildly agitated and anxious, ruminating on frustrations and negativity, c/o generalized pain. Cooperative. No longer tearful.

## 2021-09-19 NOTE — ED Notes (Signed)
APS rep into room, at [facility]

## 2021-09-19 NOTE — ED Notes (Signed)
Pt verbalized to APS rep would like to go home, but agreeable to ALF/ SNF if needed. Pt ambulatory to b/r after APS interview, steady gait. Pt verbalized appreciation and helpfulness of hearing of plans and possibilities

## 2021-11-13 ENCOUNTER — Ambulatory Visit (HOSPITAL_COMMUNITY)
Admission: EM | Admit: 2021-11-13 | Discharge: 2021-11-13 | Disposition: A | Discharge status: Home / Self Care | Payer: No Typology Code available for payment source | Attending: Psychiatry | Admitting: Psychiatry

## 2021-11-13 DIAGNOSIS — Z789 Other specified health status: Secondary | ICD-10-CM

## 2021-11-13 DIAGNOSIS — F32A Depression, unspecified: Secondary | ICD-10-CM | POA: Insufficient documentation

## 2021-11-13 DIAGNOSIS — Z79899 Other long term (current) drug therapy: Secondary | ICD-10-CM | POA: Insufficient documentation

## 2021-11-13 DIAGNOSIS — Z5986 Financial insecurity: Secondary | ICD-10-CM | POA: Insufficient documentation

## 2021-11-13 NOTE — ED Provider Notes (Signed)
Behavioral Health Urgent Care Medical Screening Exam  Patient Name: [name] MRN: [id number] Date of Evaluation: 11/13/21 Chief Complaint:   Diagnosis:  Final diagnoses:  Need for community resource  Depression, unspecified depression type   History of Present illness: [name] is a 61 y.o. male. Pt presents voluntarily to [facility] behavioral health for walk-in assessment. Pt is assessed face-to-face by nurse practitioner.   [name], 61 y.o., male patient seen face to face by this provider, and chart reviewed on 11/13/21.  On evaluation [name] reports he is presenting today for several reasons, including he has been depressed for over 10 years, he is behind on rent and light bills, he wants a full time CNA. When asked about auditory visual hallucinations, pt denies this, although reports that he experiences vivid dreams about friends who have passed away. Pt denies SI, states "I don't want to kill myself". He denies HI/VI. He denies plan or intent. He denies hx of SA, NSSIB. He reports hx of inpatient psychiatric hospitalizations, including at [facility] for 30 days due to depression. Pt reports he is prescribed Xanax 2mg  QID, has been taking Xanax for the past 10 years. He reports he receives his medication from Dr. Karie Kirks of [facility], who has been his PCP for the past 25 years. Pt denies alcohol, marijuana, nicotine, other substance use. Pt reports he is not connected with counseling services. He reports he is living alone. He denies access to a firearm or other weapon. He reports his highest level of education is the 8th grade. He reports he is retired, he receives $350/375 from [employer] and $575 from retirement. He reports he was working with DSS caseworker Maeola Sarah, but since he was doing well, closed his case. He states he recently reached out to Maeola Sarah to discuss that he wants to re-open case to get additional assistance.   Discussed with  pt recommendation for outpatient medication management and counseling. Discussed additional resources available to pt, including [facility], [employer]. Discussed continuing to work with DSS caseworker for additional assistance.  On assessment, pt is a&ox3, in no acute distress, non-toxic appearing. Pt is calm, cooperative, answers assessment questions appropriately. There is no evidence of agitation, aggression, or distractibility. No paranoia or delusions elicited. Pt denies SI/VI/HI, AVH, paranoia.   Psychiatric Specialty Exam  Presentation  General Appearance:Casual; Appropriate for Environment; Fairly Groomed  Eye Contact:Fair  Speech:Clear and Coherent; Normal Rate  Speech Volume:Normal  Handedness:Right   Mood and Affect  Mood: Depressed  Affect: Flat   Thought Process  Thought Processes: Coherent; Goal Directed; Linear  Descriptions of Associations:Intact  Orientation:Full (Time, Place and Person)  Thought Content:Logical  Diagnosis of Schizophrenia or Schizoaffective disorder in past: No   Hallucinations:None  Ideas of Reference:None  Suicidal Thoughts:No  Homicidal Thoughts:No   Sensorium  Memory: Immediate Good  Judgment: Intact  Insight: Present   Executive Functions  Concentration: Good  Attention Span: Good  Recall: Good  Fund of Knowledge: Good  Language: Good   Psychomotor Activity  Psychomotor Activity: Normal   Assets  Assets: Communication Skills; Desire for Improvement; Financial Resources/Insurance; Housing   Sleep  Sleep: Poor  Number of hours:  4   No data recorded  Physical Exam: Physical Exam Constitutional:      General: He is not in acute distress.    Appearance: He is not ill-appearing, toxic-appearing or diaphoretic.  Eyes:     General: No scleral icterus.  Right eye: No discharge.        Left eye: No discharge.  Cardiovascular:     Rate and Rhythm: Normal rate.   Pulmonary:     Effort: Pulmonary effort is normal. No respiratory distress.  Skin:    Coloration: Skin is not jaundiced or pale.  Neurological:     Mental Status: He is alert and oriented to person, place, and time.  Psychiatric:        Attention and Perception: Attention and perception normal.        Mood and Affect: Mood is depressed. Affect is flat.        Speech: Speech normal.        Behavior: Behavior normal. Behavior is cooperative.        Thought Content: Thought content normal.        Cognition and Memory: Cognition and memory normal.        Judgment: Judgment normal.    Review of Systems  Constitutional:  Negative for chills and fever.  Respiratory:  Negative for shortness of breath.   Cardiovascular:  Negative for chest pain and palpitations.  Gastrointestinal:  Negative for abdominal pain.  Musculoskeletal:  Positive for neck pain.       Pt reports chronic neck pain that has been occurring for years related to degenerative disc.    Blood pressure 109/85, pulse 92, temperature 98.3 F (36.8 C), temperature source Oral, resp. rate 18, SpO2 97 %. There is no height or weight on file to calculate BMI.  Musculoskeletal: Strength & Muscle Tone: within normal limits Gait & Station: normal Patient leans: N/A  [facility] MSE Discharge Disposition for Follow up and Recommendations: Based on my evaluation the patient does not appear to have an emergency medical condition and can be discharged with resources and follow up care in outpatient services for Medication Management and Individual Therapy  Tharon Aquas, NP 11/13/2021, 4:38 PM

## 2021-11-13 NOTE — Discharge Instructions (Addendum)
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
## 2021-12-03 ENCOUNTER — Encounter: Payer: Self-pay | Admitting: *Deleted

## 2022-03-31 ENCOUNTER — Encounter (INDEPENDENT_AMBULATORY_CARE_PROVIDER_SITE_OTHER): Payer: Self-pay | Admitting: *Deleted

## 2022-05-19 ENCOUNTER — Encounter: Payer: Self-pay | Admitting: *Deleted

## 2022-11-12 ENCOUNTER — Telehealth: Payer: Self-pay | Admitting: Internal Medicine

## 2022-11-12 NOTE — Telephone Encounter (Signed)
Called patient and informed

## 2022-11-12 NOTE — Telephone Encounter (Signed)
Needs new Patient approval.

## 2023-02-25 ENCOUNTER — Emergency Department: Payer: MEDICAID

## 2023-02-25 ENCOUNTER — Other Ambulatory Visit: Payer: Self-pay

## 2023-02-25 ENCOUNTER — Encounter: Payer: Self-pay | Admitting: Emergency Medicine

## 2023-02-25 ENCOUNTER — Emergency Department
Admission: EM | Admit: 2023-02-25 | Discharge: 2023-02-25 | Disposition: A | Discharge status: Home / Self Care | Payer: MEDICAID | Attending: Student in an Organized Health Care Education/Training Program | Admitting: Student in an Organized Health Care Education/Training Program

## 2023-02-25 DIAGNOSIS — M542 Cervicalgia: Secondary | ICD-10-CM | POA: Diagnosis present

## 2023-02-25 DIAGNOSIS — H538 Other visual disturbances: Secondary | ICD-10-CM | POA: Diagnosis not present

## 2023-02-25 DIAGNOSIS — R200 Anesthesia of skin: Secondary | ICD-10-CM | POA: Diagnosis not present

## 2023-02-25 LAB — CBC WITH DIFFERENTIAL/PLATELET
Abs Immature Granulocytes: 0.02 10*3/uL (ref 0.00–0.07)
Basophils Absolute: 0.1 10*3/uL (ref 0.0–0.1)
Basophils Relative: 1 %
Eosinophils Absolute: 0.5 10*3/uL (ref 0.0–0.5)
Eosinophils Relative: 7 %
HCT: 43.2 % (ref 39.0–52.0)
Hemoglobin: 13.5 g/dL (ref 13.0–17.0)
Immature Granulocytes: 0 %
Lymphocytes Relative: 19 %
Lymphs Abs: 1.5 10*3/uL (ref 0.7–4.0)
MCH: 27.7 pg (ref 26.0–34.0)
MCHC: 31.3 g/dL (ref 30.0–36.0)
MCV: 88.7 fL (ref 80.0–100.0)
Monocytes Absolute: 0.7 10*3/uL (ref 0.1–1.0)
Monocytes Relative: 10 %
Neutro Abs: 4.7 10*3/uL (ref 1.7–7.7)
Neutrophils Relative %: 63 %
Platelets: 229 10*3/uL (ref 150–400)
RBC: 4.87 MIL/uL (ref 4.22–5.81)
RDW: 14.5 % (ref 11.5–15.5)
WBC: 7.6 10*3/uL (ref 4.0–10.5)
nRBC: 0 % (ref 0.0–0.2)

## 2023-02-25 LAB — COMPREHENSIVE METABOLIC PANEL
ALT: 14 U/L (ref 0–44)
AST: 20 U/L (ref 15–41)
Albumin: 3.9 g/dL (ref 3.5–5.0)
Alkaline Phosphatase: 82 U/L (ref 38–126)
Anion gap: 6 (ref 5–15)
BUN: 12 mg/dL (ref 8–23)
CO2: 28 mmol/L (ref 22–32)
Calcium: 9.1 mg/dL (ref 8.9–10.3)
Chloride: 104 mmol/L (ref 98–111)
Creatinine, Ser: 1.28 mg/dL — ABNORMAL HIGH (ref 0.61–1.24)
GFR, Estimated: >60 mL/min (ref >60)
Glucose, Bld: 87 mg/dL (ref 70–99)
Potassium: 4.9 mmol/L (ref 3.5–5.1)
Sodium: 138 mmol/L (ref 135–145)
Total Bilirubin: 0.7 mg/dL (ref 0.0–1.2)
Total Protein: 6.6 g/dL (ref 6.5–8.1)

## 2023-02-25 LAB — TROPONIN I (HIGH SENSITIVITY): Troponin I (High Sensitivity): 3 ng/L (ref <18)

## 2023-02-25 MED ORDER — IOHEXOL 350 MG/ML SOLN
75.0000 mL | Freq: Once | INTRAVENOUS | Status: AC | PRN
  Administered 2023-02-25: 75 mL via INTRAVENOUS

## 2023-02-25 NOTE — ED Provider Notes (Signed)
[facility] Provider Note    Event Date/Time   First MD Initiated Contact with Patient 02/25/23 1156     (approximate)   History   Jaw Pain   HPI  [name] is a 63 y.o. male presents to the ER for evaluation of neck pain shooting up to the left side of his head associated with numbness of the left side of his head then regains sensation at the level of his ear.  Has had some blurry vision.  No other associated numbness or tingling.  Symptoms started last night.  Denies any nausea or vomiting.  No recent illness.  No recent trauma.     Physical Exam   Triage Vital Signs: ED Triage Vitals  Encounter Vitals Group     BP 02/25/23 0934 (!) 128/90     Systolic BP Percentile --      Diastolic BP Percentile --      Pulse Rate 02/25/23 0934 63     Resp 02/25/23 0934 17     Temp 02/25/23 0934 98.4 F (36.9 C)     Temp Source 02/25/23 0934 Oral     SpO2 02/25/23 0934 98 %     Weight 02/25/23 0945 195 lb (88.5 kg)     Height 02/25/23 0935 6' (1.829 m)     Head Circumference --      Peak Flow --      Pain Score 02/25/23 0944 8     Pain Loc --      Pain Education --      Exclude from Growth Chart --     Most recent vital signs: Vitals:   02/25/23 0934 02/25/23 1412  BP: (!) 128/90 120/88  Pulse: 63 60  Resp: 17 16  Temp: 98.4 F (36.9 C) 98 F (36.7 C)  SpO2: 98% 98%     Constitutional: Alert  Eyes: Conjunctivae are normal.  Head: Atraumatic. Nose: No congestion/rhinnorhea. Mouth/Throat: Mucous membranes are moist.   Neck: Painless ROM.  Cardiovascular:   Good peripheral circulation. Respiratory: Normal respiratory effort.  No retractions.  Gastrointestinal: Soft and nontender.  Musculoskeletal:  no deformity Neurologic:  MAE spontaneously.  Decreased sensation of the left head to light touch and pinprick.  Regains sensation at the level of the ear and below.  Cranial nerves are otherwise intact. Skin:  Skin is warm, dry and  intact. No rash noted. Psychiatric: Mood and affect are normal. Speech and behavior are normal.    ED Results / Procedures / Treatments   Labs (all labs ordered are listed, but only abnormal results are displayed) Labs Reviewed  COMPREHENSIVE METABOLIC PANEL - Abnormal; Notable for the following components:      Result Value   Creatinine, Ser 1.28 (*)    All other components within normal limits  CBC WITH DIFFERENTIAL/PLATELET  TROPONIN I (HIGH SENSITIVITY)     EKG  ED ECG REPORT I, Willy Eddy, the attending physician, personally viewed and interpreted this ECG.   Date: 02/25/2023  EKG Time: 9:34  Rate: 70  Rhythm: sinus  Axis: normal  Intervals: normal  ST&T Change: no stemi, no depressions    RADIOLOGY Please see ED Course for my review and interpretation.  I personally reviewed all radiographic images ordered to evaluate for the above acute complaints and reviewed radiology reports and findings.  These findings were personally discussed with the patient.  Please see medical record for radiology report.    PROCEDURES:  Critical Care performed:  No  Procedures   MEDICATIONS ORDERED IN ED: Medications  iohexol (OMNIPAQUE) 350 MG/ML injection 75 mL (75 mLs Intravenous Contrast Given 02/25/23 1300)     IMPRESSION / MDM / ASSESSMENT AND PLAN / ED COURSE  I reviewed the triage vital signs and the nursing notes.                              Differential diagnosis includes, but is not limited to, cva, tia, hypoglycemia, dehydration, electrolyte abnormality, dissection, sepsis  Patient presenting to the ER for evaluation of symptoms as described above.  Based on symptoms, risk factors and considered above differential, this presenting complaint could reflect a potentially life-threatening illness therefore the patient will be placed on continuous pulse oximetry and telemetry for monitoring.  Laboratory evaluation will be sent to evaluate for the above  complaints.  CT imaging will be ordered for the but differential.  He is outside of the window for code stroke.  Possible neuropathy or atypical migraine but is not complaining significant headache.  Has history of chronic neck and back pain denies any significant change in the pain.    Clinical Course as of 02/25/23 1449  Thu Feb 25, 2023  1446 CTA without acute abnormality.  Given the numbness tingling in his head will order MRI to further evaluate for evidence of CVA.  Patient agreeable plan.  Remainder of workup is otherwise reassuring. [PR]    Clinical Course User Index [PR] Willy Eddy, MD     FINAL CLINICAL IMPRESSION(S) / ED DIAGNOSES   Final diagnoses:  Numbness of face  Neck pain     Rx / DC Orders   ED Discharge Orders     None        Note:  This document was prepared using Dragon voice recognition software and may include unintentional dictation errors.    Willy Eddy, MD 02/25/23 [phone]

## 2023-02-25 NOTE — ED Triage Notes (Addendum)
Patient states his left ear began hurting yesterday and during the night and now has a sharp pain from left jaw and radiated up to top of head. Patient states the top of the head is numb. Patient has clear speech and face is symmetrical. Patient has a strong, equal grip.

## 2023-02-25 NOTE — ED Provider Notes (Signed)
-----------------------------------------   5:15 PM on 02/25/2023 -----------------------------------------  Patient care assumed from Dr. Roxan Hockey.  Patient's MRI has resulted showing no concerning findings.  Given the negative MRI believe the patient will be safe for discharge home with outpatient follow-up with his PCP.  Patient agreeable to plan of care.   Minna Antis, MD 02/25/23 [phone]

## 2023-02-25 NOTE — ED Notes (Signed)
First Nurse Note: Pt to ED via [facility] from PCP office. Pt reports that he woke up this morning around 0200 with pain in his neck that radiated into his head, after the pain resolved he started having numbness in the left side of his face. Symptoms started at 0200. Pt ambulatory on EMS arrival and arrival to ED, no slurred speech.  CBG 101 BP 158/95 HR 70  SpO2-97%

## 2023-02-25 NOTE — Discharge Instructions (Signed)
Please follow-up with your doctor within the next several days for recheck/reevaluation.  As we discussed your workup in the emergency department shown reassuring results including a normal MRI of your brain.  Return to the emergency department for any worsening weakness or numbness, development of any weakness or numbness of any arm or leg confusion or slurred speech.

## 2023-06-05 ENCOUNTER — Emergency Department
Admission: EM | Admit: 2023-06-05 | Discharge: 2023-06-05 | Disposition: A | Discharge status: Home / Self Care | Payer: MEDICAID | Attending: Emergency Medicine | Admitting: Emergency Medicine

## 2023-06-05 ENCOUNTER — Other Ambulatory Visit: Payer: Self-pay

## 2023-06-05 ENCOUNTER — Emergency Department: Payer: MEDICAID

## 2023-06-05 DIAGNOSIS — J069 Acute upper respiratory infection, unspecified: Secondary | ICD-10-CM | POA: Diagnosis not present

## 2023-06-05 DIAGNOSIS — K5900 Constipation, unspecified: Secondary | ICD-10-CM | POA: Insufficient documentation

## 2023-06-05 DIAGNOSIS — K862 Cyst of pancreas: Secondary | ICD-10-CM | POA: Diagnosis not present

## 2023-06-05 DIAGNOSIS — R109 Unspecified abdominal pain: Secondary | ICD-10-CM | POA: Diagnosis present

## 2023-06-05 LAB — COMPREHENSIVE METABOLIC PANEL WITH GFR
ALT: 13 U/L (ref 0–44)
AST: 25 U/L (ref 15–41)
Albumin: 4.2 g/dL (ref 3.5–5.0)
Alkaline Phosphatase: 87 U/L (ref 38–126)
Anion gap: 8 (ref 5–15)
BUN: 19 mg/dL (ref 8–23)
CO2: 27 mmol/L (ref 22–32)
Calcium: 8.8 mg/dL — ABNORMAL LOW (ref 8.9–10.3)
Chloride: 102 mmol/L (ref 98–111)
Creatinine, Ser: 1.35 mg/dL — ABNORMAL HIGH (ref 0.61–1.24)
GFR, Estimated: 59 mL/min — ABNORMAL LOW (ref >60)
Glucose, Bld: 86 mg/dL (ref 70–99)
Potassium: 4 mmol/L (ref 3.5–5.1)
Sodium: 137 mmol/L (ref 135–145)
Total Bilirubin: 0.7 mg/dL (ref 0.0–1.2)
Total Protein: 7.1 g/dL (ref 6.5–8.1)

## 2023-06-05 LAB — RESP PANEL BY RT-PCR (RSV, FLU A&B, COVID)  RVPGX2
Influenza A by PCR: NEGATIVE
Influenza B by PCR: NEGATIVE
Resp Syncytial Virus by PCR: NEGATIVE
SARS Coronavirus 2 by RT PCR: NEGATIVE

## 2023-06-05 LAB — CBC WITH DIFFERENTIAL/PLATELET
Abs Immature Granulocytes: 0.07 10*3/uL (ref 0.00–0.07)
Basophils Absolute: 0 10*3/uL (ref 0.0–0.1)
Basophils Relative: 1 %
Eosinophils Absolute: 0.2 10*3/uL (ref 0.0–0.5)
Eosinophils Relative: 5 %
HCT: 43 % (ref 39.0–52.0)
Hemoglobin: 13.1 g/dL (ref 13.0–17.0)
Immature Granulocytes: 1 %
Lymphocytes Relative: 16 %
Lymphs Abs: 0.8 10*3/uL (ref 0.7–4.0)
MCH: 27.2 pg (ref 26.0–34.0)
MCHC: 30.5 g/dL (ref 30.0–36.0)
MCV: 89.2 fL (ref 80.0–100.0)
Monocytes Absolute: 0.8 10*3/uL (ref 0.1–1.0)
Monocytes Relative: 17 %
Neutro Abs: 2.9 10*3/uL (ref 1.7–7.7)
Neutrophils Relative %: 60 %
Platelets: 196 10*3/uL (ref 150–400)
RBC: 4.82 MIL/uL (ref 4.22–5.81)
RDW: 14.6 % (ref 11.5–15.5)
WBC: 4.9 10*3/uL (ref 4.0–10.5)
nRBC: 0 % (ref 0.0–0.2)

## 2023-06-05 LAB — LIPASE, BLOOD: Lipase: 50 U/L (ref 11–51)

## 2023-06-05 MED ORDER — IOHEXOL 300 MG/ML  SOLN
100.0000 mL | Freq: Once | INTRAMUSCULAR | Status: AC | PRN
  Administered 2023-06-05: 100 mL via INTRAVENOUS

## 2023-06-05 NOTE — ED Triage Notes (Signed)
 Pt reports abdominal pain x 3 years from a previous cyst. Pt also reports having a runny nose x 24 hours and reports to EMS that his runny nose is from the fluid build up from the cyst. Pt also demands x-rays "need to be done today."

## 2023-06-05 NOTE — ED Provider Notes (Signed)
 [facility] Provider Note    Event Date/Time   First MD Initiated Contact with Patient 06/05/23 [phone]     (approximate)   History   Abdominal Pain   HPI  [name] is a 63 y.o. male who presents today for evaluation of abdominal pain and bloating for the past 5 days.  He reports that when this happens he is told that he is constipated.  He reports that he has not been passing much gas over the last 4 to 5 days.  He reports that he is nauseated but has not had any vomiting.  No fevers or chills.  No blood in his stool.  He was told that he has a cyst on his pancreas.  He also reports that he has a runny nose and has been sneezing since yesterday.  He is afraid that his runny nose is coming from buildup in his lungs, though he denies chest pain or shortness of breath.  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   Lightheadedness 11/12/2015   White matter abnormality on MRI of brain 11/12/2015   Left facial numbness 08/08/2015   TIA (transient ischemic attack) 08/08/2015   Cervical spondylosis with radiculopathy 05/28/2014   Altered mental status 08/23/2013   Aphasia 08/23/2013   IBS (irritable bowel syndrome) 11/14/2012   Weight loss 06/13/2012   Abdominal pain, epigastric 04/11/2012   DJD (degenerative joint disease) 04/11/2012   Generalized anxiety disorder 04/11/2012          Physical Exam   Triage Vital Signs: ED Triage Vitals  Encounter Vitals Group     BP 06/05/23 0633 (!) 142/99     Systolic BP Percentile --      Diastolic BP Percentile --      Pulse Rate 06/05/23 0633 84     Resp 06/05/23 0633 16     Temp 06/05/23 0633 98.4 F (36.9 C)     Temp Source 06/05/23 0633 Oral     SpO2 06/05/23 0633 100 %     Weight 06/05/23 0628 185 lb (83.9 kg)     Height 06/05/23 0628 6' (1.829 m)     Head Circumference --      Peak Flow --      Pain Score 06/05/23  0627 10     Pain Loc --      Pain Education --      Exclude from Growth Chart --     Most recent vital signs: Vitals:   06/05/23 0633 06/05/23 1013  BP: (!) 142/99 123/89  Pulse: 84 72  Resp: 16 16  Temp: 98.4 F (36.9 C)   SpO2: 100% 99%    Physical Exam Vitals and nursing note reviewed.  Constitutional:      General: Awake and alert. No acute distress.    Appearance: Normal appearance. The patient is normal weight.  HENT:     Head: Normocephalic and atraumatic.     Mouth: Mucous membranes are moist.  Eyes:     General: PERRL. Normal EOMs        Right eye: No discharge.        Left eye: No discharge.     Conjunctiva/sclera: Conjunctivae normal.  Cardiovascular:     Rate and Rhythm: Normal rate and regular rhythm.     Pulses: Normal pulses.  Pulmonary:     Effort: Pulmonary effort is normal. No respiratory distress.  Breath sounds: Normal breath sounds.  Abdominal:     Abdomen is soft. There is no abdominal tenderness. No rebound or guarding. No distention. Musculoskeletal:        General: No swelling. Normal range of motion.     Cervical back: Normal range of motion and neck supple.  Skin:    General: Skin is warm and dry.     Capillary Refill: Capillary refill takes less than 2 seconds.     Findings: No rash.  Neurological:     Mental Status: The patient is awake and alert.      ED Results / Procedures / Treatments   Labs (all labs ordered are listed, but only abnormal results are displayed) Labs Reviewed  COMPREHENSIVE METABOLIC PANEL WITH GFR - Abnormal; Notable for the following components:      Result Value   Creatinine, Ser 1.35 (*)    Calcium 8.8 (*)    GFR, Estimated 59 (*)    All other components within normal limits  RESP PANEL BY RT-PCR (RSV, FLU A&B, COVID)  RVPGX2  CBC WITH DIFFERENTIAL/PLATELET  LIPASE, BLOOD     EKG     RADIOLOGY I independently reviewed and interpreted imaging and agree with radiologists  findings.     PROCEDURES:  Critical Care performed:   Procedures   MEDICATIONS ORDERED IN ED: Medications  iohexol  (OMNIPAQUE ) 300 MG/ML solution 100 mL (100 mLs Intravenous Contrast Given 06/05/23 0847)     IMPRESSION / MDM / ASSESSMENT AND PLAN / ED COURSE  I reviewed the triage vital signs and the nursing notes.   Differential diagnosis includes, but is not limited to, constipation, viral infection, obstruction, mass, gastritis, pancreatitis.  Patient is awake and alert, hemodynamically stable and afebrile.  He has no reproducible abdominal tenderness on exam. Further workup is indicated.  Labs were obtained. Given his concerns for decreased bowel movements, decrease gas, and abdominal bloating, CT scan was obtained for evaluation of possible obstruction.  Given his complaint of runny nose and sneezing, COVID/flu/RSV swab obtained as well.  Labs are overall reassuring.  COVID/flu/RSV swab is negative.  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  Recommendations are for follow-up with MRCP in 1 year, discussed this with the patient who understands and agrees.  We discussed symptomatic management and return precautions.  He is able to eat and drink without difficulty. Patient understands and agrees with plan.  He was discharged in stable condition.   Patient's presentation is most consistent with acute complicated illness / injury requiring diagnostic workup.     FINAL CLINICAL IMPRESSION(S) / ED DIAGNOSES   Final diagnoses:  Pancreatic cyst  Upper respiratory tract infection, unspecified type     Rx / DC Orders   ED Discharge Orders     None        Note:  This document was prepared using Dragon voice recognition software and may include unintentional dictation errors.   Lakasha Mcfall E, PA-C 06/05/23 1031    Kandee Orion, MD 06/05/23 [phone]

## 2023-06-05 NOTE — Discharge Instructions (Signed)
 The cyst on your pancreas looks the same as it did in 2023, please follow-up with gastroenterology in 1 year or sooner as needed.  Please return to the emergency department for any new, worsening, or change in symptoms or other concerns.  It was a pleasure caring for you today.
